# Patient Record
Sex: Male | Born: 1985 | Race: White | Hispanic: No | Marital: Married | Smoking: Current every day smoker
Health system: Southern US, Community
[De-identification: ages and names within clinical notes are randomized; demographics above are authoritative.]

## PROBLEM LIST (undated history)

## (undated) DIAGNOSIS — Z87442 Personal history of urinary calculi: Secondary | ICD-10-CM

## (undated) DIAGNOSIS — Z8619 Personal history of other infectious and parasitic diseases: Secondary | ICD-10-CM

## (undated) DIAGNOSIS — I471 Supraventricular tachycardia, unspecified: Secondary | ICD-10-CM

## (undated) DIAGNOSIS — N200 Calculus of kidney: Secondary | ICD-10-CM

## (undated) DIAGNOSIS — I10 Essential (primary) hypertension: Secondary | ICD-10-CM

## (undated) DIAGNOSIS — M549 Dorsalgia, unspecified: Secondary | ICD-10-CM

## (undated) DIAGNOSIS — I499 Cardiac arrhythmia, unspecified: Secondary | ICD-10-CM

## (undated) HISTORY — DX: Personal history of other infectious and parasitic diseases: Z86.19

## (undated) HISTORY — PX: FRACTURE SURGERY: SHX138

---

## 2003-02-12 HISTORY — PX: ORIF TIBIA & FIBULA FRACTURES: SHX2131

## 2004-12-03 ENCOUNTER — Emergency Department (HOSPITAL_COMMUNITY): Admission: EM | Admit: 2004-12-03 | Discharge: 2004-12-03 | Payer: Self-pay | Admitting: *Deleted

## 2012-10-18 ENCOUNTER — Emergency Department (HOSPITAL_BASED_OUTPATIENT_CLINIC_OR_DEPARTMENT_OTHER)
Admission: EM | Admit: 2012-10-18 | Discharge: 2012-10-18 | Disposition: A | Discharge status: Home / Self Care | Payer: BC Managed Care – PPO | Attending: Emergency Medicine | Admitting: Emergency Medicine

## 2012-10-18 ENCOUNTER — Encounter (HOSPITAL_BASED_OUTPATIENT_CLINIC_OR_DEPARTMENT_OTHER): Payer: Self-pay | Admitting: Emergency Medicine

## 2012-10-18 ENCOUNTER — Emergency Department (HOSPITAL_BASED_OUTPATIENT_CLINIC_OR_DEPARTMENT_OTHER): Payer: BC Managed Care – PPO

## 2012-10-18 DIAGNOSIS — N200 Calculus of kidney: Secondary | ICD-10-CM

## 2012-10-18 DIAGNOSIS — Z79899 Other long term (current) drug therapy: Secondary | ICD-10-CM | POA: Insufficient documentation

## 2012-10-18 HISTORY — DX: Calculus of kidney: N20.0

## 2012-10-18 LAB — URINALYSIS, ROUTINE W REFLEX MICROSCOPIC
Glucose, UA: NEGATIVE mg/dL
Ketones, ur: 15 mg/dL — AB
Leukocytes, UA: NEGATIVE
pH: 7.5 (ref 5.0–8.0)

## 2012-10-18 LAB — BASIC METABOLIC PANEL
Calcium: 9.6 mg/dL (ref 8.4–10.5)
Chloride: 101 mEq/L (ref 96–112)
Creatinine, Ser: 1.2 mg/dL (ref 0.50–1.35)
GFR calc Af Amer: >90 mL/min (ref >90)

## 2012-10-18 LAB — URINE MICROSCOPIC-ADD ON

## 2012-10-18 LAB — CBC
Platelets: 221 10*3/uL (ref 150–400)
RDW: 13.2 % (ref 11.5–15.5)
WBC: 6.8 10*3/uL (ref 4.0–10.5)

## 2012-10-18 MED ORDER — TAMSULOSIN HCL 0.4 MG PO CAPS: 0.4000 mg | ORAL_CAPSULE | Freq: Every day | ORAL | Status: DC

## 2012-10-18 MED ORDER — ONDANSETRON HCL 4 MG/2ML IJ SOLN
4.0000 mg | INTRAMUSCULAR | Status: AC
  Administered 2012-10-18: 4 mg via INTRAVENOUS
  Filled 2012-10-18: qty 2

## 2012-10-18 MED ORDER — OXYCODONE-ACETAMINOPHEN 5-325 MG PO TABS: 1.0000 | ORAL_TABLET | Freq: Four times a day (QID) | ORAL | Status: DC | PRN

## 2012-10-18 MED ORDER — KETOROLAC TROMETHAMINE 30 MG/ML IJ SOLN
30.0000 mg | Freq: Once | INTRAMUSCULAR | Status: AC
  Administered 2012-10-18: 30 mg via INTRAVENOUS
  Filled 2012-10-18: qty 1

## 2012-10-18 MED ORDER — ONDANSETRON HCL 4 MG PO TABS: 4.0000 mg | ORAL_TABLET | Freq: Four times a day (QID) | ORAL | Status: DC

## 2012-10-18 NOTE — ED Notes (Signed)
Left side flank pain with burning and hematuria since this am.  Pt states history of kidney stones.

## 2012-10-18 NOTE — ED Provider Notes (Signed)
CSN: [id number]     Arrival date & time 10/18/12  1743 History   First MD Initiated Contact with Patient 10/18/12 2006     Chief Complaint  Patient presents with  . Flank Pain  . Hematuria   (Consider location/radiation/quality/duration/timing/severity/associated sxs/prior Treatment) HPI Comments: Patient is a 27 year old male with a history of kidney stones x4 who presents for left flank pain with onset this morning. Patient states the pain is intermittent and wavelike and nonradiating. Patient has taken hydrocodone for the pain with mild relief. He admits to associated hematuria and denies associated nausea, emesis, fevers, chest pain or shortness of breath, anterior abdominal pain, dysuria, diarrhea, melena or hematochezia, and numbness or tingling in his extremities. Patient denies a history of lithotripsy or surgical intervention for stone removal. He is not currently followed by a urologist.  The history is provided by the patient. No language interpreter was used.    Past Medical History  Diagnosis Date  . Kidney stones   . Tachycardia    No past surgical history on file. No family history on file. History  Substance Use Topics  . Smoking status: Never Smoker   . Smokeless tobacco: Current User    Types: Chew  . Alcohol Use: Not on file    Review of Systems  Genitourinary: Positive for hematuria and flank pain.  All other systems reviewed and are negative.    Allergies  Review of patient's allergies indicates no known allergies.  Home Medications   Current Outpatient Rx  Name  Route  Sig  Dispense  Refill  . ondansetron (ZOFRAN) 4 MG tablet   Oral   Take 1 tablet (4 mg total) by mouth every 6 (six) hours.   12 tablet   0   . oxyCODONE-acetaminophen (PERCOCET/ROXICET) 5-325 MG per tablet   Oral   Take 1-2 tablets by mouth every 6 (six) hours as needed for pain.   19 tablet   0   . tamsulosin (FLOMAX) 0.4 MG CAPS capsule   Oral   Take 1 capsule (0.4 mg  total) by mouth daily. Until passage of your stone   10 capsule   0    BP 126/53  Pulse 70  Temp(Src) 98.5 F (36.9 C) (Oral)  Resp 16  Ht 5\' 8"  (1.727 m)  Wt 162 lb (73.483 kg)  BMI 24.64 kg/m2  SpO2 100%  Physical Exam  Nursing note and vitals reviewed. Constitutional: He is oriented to person, place, and time. He appears well-developed and well-nourished. No distress.  HENT:  Head: Normocephalic and atraumatic.  Eyes: Conjunctivae and EOM are normal. No scleral icterus.  Neck: Normal range of motion.  Cardiovascular: Normal rate, regular rhythm, normal heart sounds and intact distal pulses.   Pulmonary/Chest: Effort normal and breath sounds normal. No respiratory distress. He has no wheezes. He has no rales.  Abdominal: Soft. He exhibits no distension and no mass. There is tenderness (L CVA (mild)). There is no rebound and no guarding.  No peritoneal signs  Musculoskeletal: Normal range of motion.  Neurological: He is alert and oriented to person, place, and time.  Skin: Skin is warm and dry. No rash noted. He is not diaphoretic. No erythema. No pallor.  Psychiatric: He has a normal mood and affect. His behavior is normal.   ED Course  Procedures (including critical care time) Labs Review Labs Reviewed  URINALYSIS, ROUTINE W REFLEX MICROSCOPIC - Abnormal; Notable for the following:    APPearance CLOUDY (*)  Hgb urine dipstick MODERATE (*)    Ketones, ur 15 (*)    All other components within normal limits  URINE MICROSCOPIC-ADD ON - Abnormal; Notable for the following:    Bacteria, UA MANY (*)    All other components within normal limits  BASIC METABOLIC PANEL - Abnormal; Notable for the following:    GFR calc non Af Amer 81 (*)    All other components within normal limits  CBC   Imaging Review Ct Abdomen Pelvis Wo Contrast  10/18/2012   *RADIOLOGY REPORT*  Clinical Data: Left-sided flank pain with burning and hematuria since this morning.  History of kidney  stones.  CT ABDOMEN AND PELVIS WITHOUT CONTRAST  Technique:  Multidetector CT imaging of the abdomen and pelvis was performed following the standard protocol without intravenous contrast.  Comparison: None.  Findings: The lung bases are clear.  Two intrarenal stones are demonstrated in the mid pole of the left kidney, largest measuring about 2 mm diameter.  There is mild pyelocaliectasis and ureterectasis on the left with a tiny stone in the distal left ureter just above the ureterovesicle junction.  The stone measures about 1 mm diameter.  Changes are consistent with mild proximal obstruction.  No bladder stone or bladder wall thickening.  No right renal or ureteral stone demonstrated.  Right renal collecting system and ureter are decompressed.  The unenhanced appearance of the liver, spleen, gallbladder, pancreas, adrenal glands, abdominal aorta, inferior vena cava, and retroperitoneal lymph nodes is unremarkable.  Stomach is filled with food and fluid without wall thickening.  A small bowel are decompressed.  Stool filled colon without distension.  No free air or free fluid in the abdomen.  Pelvis:  Calcification in the prostate gland.  No free or loculated pelvic fluid collections.  Stool filled rectosigmoid colon without evidence of diverticulitis.  The appendix is normal.  No significant pelvic lymphadenopathy.  Normal alignment of the lumbar spine.  No destructive bone lesions appreciated.  IMPRESSION: Nonobstructing intrarenal stones on the left.  1 mm stone in the distal left ureter with mild proximal obstructing changes.   Original Report Authenticated By: Burman Nieves, M.D.    MDM   1. Kidney stone    Uncomplicated left distal ureteral kidney stone in 27 year old male patient with preserved kidney function. Patient well and nontoxic appearing, hemodynamically stable, and afebrile. Pain well managed in ED with Zofran and Toradol. Patient appropriate for discharge with urology followup for further  evaluation of symptoms. Flomax, Percocet, and Zofran prescribed for continued symptom management. Return precautions discussed and patient are agreeable to plan with no unaddressed concerns.   Antony Madura, PA-C 10/18/12 2145

## 2012-10-19 NOTE — ED Provider Notes (Signed)
Medical screening examination/treatment/procedure(s) were performed by non-physician practitioner and as supervising physician I was immediately available for consultation/collaboration.  Doug Sou, MD 10/19/12 [phone]

## 2014-05-04 ENCOUNTER — Encounter: Payer: Self-pay | Admitting: *Deleted

## 2014-05-04 ENCOUNTER — Ambulatory Visit (INDEPENDENT_AMBULATORY_CARE_PROVIDER_SITE_OTHER): Payer: PRIVATE HEALTH INSURANCE | Admitting: Internal Medicine

## 2014-05-04 ENCOUNTER — Encounter: Payer: Self-pay | Admitting: Internal Medicine

## 2014-05-04 VITALS — BP 120/74 | HR 61 | Ht 68.0 in | Wt 169.0 lb

## 2014-05-04 DIAGNOSIS — Z01812 Encounter for preprocedural laboratory examination: Secondary | ICD-10-CM | POA: Diagnosis not present

## 2014-05-04 DIAGNOSIS — I471 Supraventricular tachycardia: Secondary | ICD-10-CM | POA: Diagnosis not present

## 2014-05-04 LAB — CBC WITH DIFFERENTIAL/PLATELET
BASOS PCT: 0.9 % (ref 0.0–3.0)
Basophils Absolute: 0 10*3/uL (ref 0.0–0.1)
EOS PCT: 2.6 % (ref 0.0–5.0)
Eosinophils Absolute: 0.1 10*3/uL (ref 0.0–0.7)
HEMATOCRIT: 42.5 % (ref 39.0–52.0)
Hemoglobin: 14.6 g/dL (ref 13.0–17.0)
LYMPHS PCT: 34.4 % (ref 12.0–46.0)
Lymphs Abs: 1.9 10*3/uL (ref 0.7–4.0)
MCHC: 34.4 g/dL (ref 30.0–36.0)
MCV: 88.9 fl (ref 78.0–100.0)
MONOS PCT: 6.7 % (ref 3.0–12.0)
Monocytes Absolute: 0.4 10*3/uL (ref 0.1–1.0)
NEUTROS ABS: 3.1 10*3/uL (ref 1.4–7.7)
Neutrophils Relative %: 55.4 % (ref 43.0–77.0)
Platelets: 195 10*3/uL (ref 150.0–400.0)
RBC: 4.78 Mil/uL (ref 4.22–5.81)
RDW: 13.4 % (ref 11.5–15.5)
WBC: 5.6 10*3/uL (ref 4.0–10.5)

## 2014-05-04 LAB — BASIC METABOLIC PANEL
BUN: 9 mg/dL (ref 6–23)
CO2: 30 mEq/L (ref 19–32)
CREATININE: 0.82 mg/dL (ref 0.40–1.50)
Calcium: 9.3 mg/dL (ref 8.4–10.5)
Chloride: 106 mEq/L (ref 96–112)
GFR: 116.88 mL/min (ref >60.00)
GLUCOSE: 76 mg/dL (ref 70–99)
POTASSIUM: 3.8 meq/L (ref 3.5–5.1)
Sodium: 139 mEq/L (ref 135–145)

## 2014-05-04 NOTE — Progress Notes (Signed)
ELECTROPHYSIOLOGY CONSULT NOTE  Patient ID: Parker Carey, MRN: [id number], DOB/AGE: 31/06/1985 28 y.o. Admit date: (Not on file) Date of Consult: 05/04/2014  Primary Physician: No PCP Per Patient Primary Cardiologist: new  Chief Complaint: SVT   HPI Parker Carey is a 29 y.o. male  Seen with a 4 year history of abrupt onset offset tachypalpitations. They're associated with some degree of lightheadedness. They are frequently provoked by bending. They are frog negative and diuretic negative. On one occasion in [place] he was seen by EMS and given an intravenous medication almost certainly edematous and by his description and his documented tachycardia 240 bpm terminated.  He has no other cardiovascular symptoms.  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 He is concerned about the possibility of lightheadedness with the recurrent event.  He has been treated with beta blockers and calcium blockers now back again on beta blockers. They have been ineffective and poorly tolerated.   Past Medical History  Diagnosis Date  . Kidney stones   . Tachycardia       Surgical History: No past surgical history on file.   Home Meds: Prior to Admission medications   Medication Sig Start Date End Date Taking? Authorizing Provider  atenolol (TENORMIN) 25 MG tablet Take 25 mg by mouth daily.   Yes Historical Provider, MD      Allergies: No Known Allergies  History   Social History  . Marital Status: Married    Spouse Name: N/A  . Number of Children: N/A  . Years of Education: N/A   Occupational History  . Not on file.   Social History Main Topics  . Smoking status: Never Smoker   . Smokeless tobacco: Current User    Types: Chew  . Alcohol Use: Not on file  . Drug Use: Not on file  . Sexual Activity: Not on file   Other Topics Concern  . Not on file   Social History Narrative     No family history  on file.   ROS:  Please see the history of present illness.     All other systems reviewed and negative.    Physical Exam:   Blood pressure 120/74, pulse 61, height 5\' 8"  (1.727 m), weight 169 lb (76.658 kg). General: Well developed, well nourished male in no acute distress. Head: Normocephalic, atraumatic, sclera non-icteric, no xanthomas, nares are without discharge. EENT: normal Lymph Nodes:  none Back: without scoliosis/kyphosis , no CVA tendersness Neck: Negative for carotid bruits. JVD not elevated. Lungs: Clear bilaterally to auscultation without wheezes, rales, or rhonchi. Breathing is unlabored. Heart: RRR with S1 S2. No murmur , rubs, or gallops appreciated. Abdomen: Soft, non-tender, non-distended with normoactive bowel sounds. No hepatomegaly. No rebound/guarding. No obvious abdominal masses. Msk:  Strength and tone appear normal for age. Extremities: No clubbing or cyanosis. No* edema.  Distal pedal pulses are 2+ and equal bilaterally. Skin: Warm and Dry Neuro: Alert and oriented X 3. CN III-XII intact Grossly normal sensory and motor function . Psych:  Responds to questions appropriately with a normal affect.      Labs: Cardiac Enzymes No results for input(s): CKTOTAL, CKMB, TROPONINI in the last 72 hours. CBC Lab Results  Component Value Date   WBC 6.8 10/18/2012   HGB 14.4 10/18/2012   HCT 41.9 10/18/2012   MCV 89.1 10/18/2012   PLT 221 10/18/2012   PROTIME: No results for input(s): LABPROT, INR in the  last 72 hours. Chemistry No results for input(s): NA, K, CL, CO2, BUN, CREATININE, CALCIUM, PROT, BILITOT, ALKPHOS, ALT, AST, GLUCOSE in the last 168 hours.  Invalid input(s): LABALBU Lipids No results found for: CHOL, HDL, LDLCALC, TRIG BNP No results found for: PROBNP Thyroid Function Tests: No results for input(s): TSH, T4TOTAL, T3FREE, THYROIDAB in the last 72 hours.  Invalid input(s): FREET3    Miscellaneous No results found for:  DDIMER  Radiology/Studies:  No results found.  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 We have discussed treatment options. He is no longer interested in taking medications. We have discussed catheter ablation with benefits and risks including but not limited to death perforation and heart block requiring pacemaker implantation. 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

## 2014-05-04 NOTE — Patient Instructions (Signed)
Your physician recommends that you continue on your current medications as directed. Please refer to the Current Medication list given to you today.  Pre procedure labs today: BMET, CBCD  Your physician has recommended that you have an ablation. Catheter ablation is a medical procedure used to treat some cardiac arrhythmias (irregular heartbeats). During catheter ablation, a long, thin, flexible tube is put into a blood vessel in your groin (upper thigh), or neck. This tube is called an ablation catheter. It is then guided to your heart through the blood vessel. Radio frequency waves destroy small areas of heart tissue where abnormal heartbeats may cause an arrhythmia to start. Please see the instruction sheet given to you today.

## 2014-05-16 ENCOUNTER — Ambulatory Visit: Payer: Self-pay | Admitting: Internal Medicine

## 2014-05-19 NOTE — Anesthesia Preprocedure Evaluation (Addendum)
Anesthesia Evaluation  Patient identified by MRN, date of birth, ID band Patient awake    Reviewed: Allergy & Precautions, NPO status , Patient's Chart, lab work & pertinent test results  Airway Mallampati: II  TM Distance: >3 FB Neck ROM: Full    Dental  (+) Teeth Intact   Pulmonary neg pulmonary ROS,          Cardiovascular negative cardio ROS  + dysrhythmias Supra Ventricular Tachycardia     Neuro/Psych negative neurological ROS  negative psych ROS   GI/Hepatic negative GI ROS, Neg liver ROS,   Endo/Other  negative endocrine ROS  Renal/GU Renal diseasenegative Renal ROS     Musculoskeletal negative musculoskeletal ROS (+)   Abdominal   Peds  Hematology negative hematology ROS (+)   Anesthesia Other Findings   Reproductive/Obstetrics                            Anesthesia Physical Anesthesia Plan  ASA: I  Anesthesia Plan: MAC   Post-op Pain Management:    Induction: Intravenous  Airway Management Planned: Simple Face Mask and Natural Airway  Additional Equipment:   Intra-op Plan:   Post-operative Plan:   Informed Consent: I have reviewed the patients History and Physical, chart, labs and discussed the procedure including the risks, benefits and alternatives for the proposed anesthesia with the patient or authorized representative who has indicated his/her understanding and acceptance.   Dental advisory given  Plan Discussed with: CRNA  Anesthesia Plan Comments:         Anesthesia Quick Evaluation

## 2014-05-20 ENCOUNTER — Ambulatory Visit (HOSPITAL_COMMUNITY): Payer: PRIVATE HEALTH INSURANCE | Admitting: Anesthesiology

## 2014-05-20 ENCOUNTER — Ambulatory Visit (HOSPITAL_COMMUNITY)
Admission: RE | Admit: 2014-05-20 | Discharge: 2014-05-20 | Disposition: A | Discharge status: Home / Self Care | Payer: PRIVATE HEALTH INSURANCE | Source: Ambulatory Visit | Attending: Internal Medicine | Admitting: Internal Medicine

## 2014-05-20 ENCOUNTER — Encounter (HOSPITAL_COMMUNITY): Payer: Self-pay | Admitting: Anesthesiology

## 2014-05-20 ENCOUNTER — Encounter (HOSPITAL_COMMUNITY)
Admission: RE | Disposition: A | Discharge status: Home / Self Care | Payer: Self-pay | Source: Ambulatory Visit | Attending: Internal Medicine

## 2014-05-20 DIAGNOSIS — I471 Supraventricular tachycardia, unspecified: Secondary | ICD-10-CM

## 2014-05-20 DIAGNOSIS — Z79899 Other long term (current) drug therapy: Secondary | ICD-10-CM | POA: Insufficient documentation

## 2014-05-20 HISTORY — PX: SUPRAVENTRICULAR TACHYCARDIA ABLATION: SHX6106

## 2014-05-20 HISTORY — DX: Supraventricular tachycardia, unspecified: I47.10

## 2014-05-20 HISTORY — DX: Supraventricular tachycardia: I47.1

## 2014-05-20 HISTORY — PX: SUPRAVENTRICULAR TACHYCARDIA ABLATION: SHX5492

## 2014-05-20 LAB — CBC
HCT: 44.2 % (ref 39.0–52.0)
HEMOGLOBIN: 15.1 g/dL (ref 13.0–17.0)
MCH: 30.9 pg (ref 26.0–34.0)
MCHC: 34.2 g/dL (ref 30.0–36.0)
MCV: 90.6 fL (ref 78.0–100.0)
Platelets: 226 10*3/uL (ref 150–400)
RBC: 4.88 MIL/uL (ref 4.22–5.81)
RDW: 13.3 % (ref 11.5–15.5)
WBC: 5.3 10*3/uL (ref 4.0–10.5)

## 2014-05-20 LAB — BASIC METABOLIC PANEL WITH GFR
Anion gap: 7 (ref 5–15)
BUN: 10 mg/dL (ref 6–23)
CO2: 26 mmol/L (ref 19–32)
Calcium: 9.4 mg/dL (ref 8.4–10.5)
Chloride: 108 mmol/L (ref 96–112)
Creatinine, Ser: 0.75 mg/dL (ref 0.50–1.35)
GFR calc Af Amer: >90 mL/min
GFR calc non Af Amer: >90 mL/min
Glucose, Bld: 91 mg/dL (ref 70–99)
Potassium: 3.7 mmol/L (ref 3.5–5.1)
Sodium: 141 mmol/L (ref 135–145)

## 2014-05-20 SURGERY — SUPRAVENTRICULAR TACHYCARDIA ABLATION
Anesthesia: Monitor Anesthesia Care

## 2014-05-20 MED ORDER — FENTANYL CITRATE 0.05 MG/ML IJ SOLN
INTRAMUSCULAR | Status: DC | PRN
  Administered 2014-05-20 (×2): 50 ug via INTRAVENOUS
  Administered 2014-05-20 (×2): 25 ug via INTRAVENOUS
  Administered 2014-05-20: 50 ug via INTRAVENOUS

## 2014-05-20 MED ORDER — SODIUM CHLORIDE 0.9 % IV SOLN
2.0000 ug/min | INTRAVENOUS | Status: AC
  Administered 2014-05-20: 2 ug/min via INTRAVENOUS
  Filled 2014-05-20: qty 2

## 2014-05-20 MED ORDER — PROPOFOL INFUSION 10 MG/ML OPTIME
INTRAVENOUS | Status: DC | PRN
  Administered 2014-05-20: 25 ug/kg/min via INTRAVENOUS

## 2014-05-20 MED ORDER — PHENYLEPHRINE HCL 10 MG/ML IJ SOLN
10.0000 mg | INTRAVENOUS | Status: DC | PRN
  Administered 2014-05-20: 60 ug/min via INTRAVENOUS

## 2014-05-20 MED ORDER — ONDANSETRON HCL 4 MG/2ML IJ SOLN
INTRAMUSCULAR | Status: DC | PRN
  Administered 2014-05-20: 4 mg via INTRAVENOUS

## 2014-05-20 MED ORDER — SODIUM CHLORIDE 0.9 % IJ SOLN: 3.0000 mL | INTRAMUSCULAR | Status: DC | PRN

## 2014-05-20 MED ORDER — ACETAMINOPHEN 325 MG PO TABS: 650.0000 mg | ORAL_TABLET | ORAL | Status: DC | PRN

## 2014-05-20 MED ORDER — ONDANSETRON HCL 4 MG/2ML IJ SOLN: 4.0000 mg | Freq: Four times a day (QID) | INTRAMUSCULAR | Status: DC | PRN

## 2014-05-20 MED ORDER — HYDROCODONE-ACETAMINOPHEN 5-325 MG PO TABS
1.0000 | ORAL_TABLET | ORAL | Status: DC | PRN
  Administered 2014-05-20 (×2): 2 via ORAL
  Filled 2014-05-20 (×2): qty 2

## 2014-05-20 MED ORDER — MIDAZOLAM HCL 5 MG/5ML IJ SOLN
INTRAMUSCULAR | Status: DC | PRN
  Administered 2014-05-20: 1 mg via INTRAVENOUS
  Administered 2014-05-20 (×2): 0.5 mg via INTRAVENOUS
  Administered 2014-05-20: 1 mg via INTRAVENOUS

## 2014-05-20 MED ORDER — LACTATED RINGERS IV SOLN
INTRAVENOUS | Status: DC | PRN
  Administered 2014-05-20: 07:00:00 via INTRAVENOUS

## 2014-05-20 MED ORDER — BUPIVACAINE HCL (PF) 0.25 % IJ SOLN
INTRAMUSCULAR | Status: AC
  Filled 2014-05-20: qty 30

## 2014-05-20 MED ORDER — SODIUM CHLORIDE 0.9 % IV SOLN: 250.0000 mL | INTRAVENOUS | Status: DC | PRN

## 2014-05-20 MED ORDER — SODIUM CHLORIDE 0.9 % IJ SOLN
3.0000 mL | Freq: Two times a day (BID) | INTRAMUSCULAR | Status: DC
Start: 2014-05-20 — End: 2014-05-20
  Administered 2014-05-20: 3 mL via INTRAVENOUS

## 2014-05-20 MED ORDER — ISOPROTERENOL HCL 0.2 MG/ML IJ SOLN
1000.0000 ug | INTRAVENOUS | Status: DC | PRN
  Administered 2014-05-20: 2 ug/min via INTRAVENOUS

## 2014-05-20 NOTE — Transfer of Care (Signed)
Immediate Anesthesia Transfer of Care Note  Patient: Parker Carey  Procedure(s) Performed: Procedure(s): SUPRAVENTRICULAR TACHYCARDIA ABLATION (N/A)  Patient Location: PACU, Cath Lab and Cath Lab Recovery  Anesthesia Type:MAC  Level of Consciousness: awake, alert , oriented and patient cooperative  Airway & Oxygen Therapy: Patient Spontanous Breathing  Post-op Assessment: Report given to RN, Post -op Vital signs reviewed and stable and SpO2 100 on rm air  Post vital signs: Reviewed and stable  Last Vitals:  Filed Vitals:   05/20/14 0612  BP: 115/54  Pulse: 76  Temp: 36.7 C  Resp: 17    Complications: No apparent anesthesia complications

## 2014-05-20 NOTE — Addendum Note (Signed)
Addendum  created 05/20/14 1430 by Marni GriffonKaren B Janet Decesare, CRNA   Modules edited: Anesthesia Attestations

## 2014-05-20 NOTE — Op Note (Signed)
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 Comprehensive EP study.  2. Coronary sinus pacing and recording.  3. Mapping of supraventricular tachycardia.  4. Radiofrequency ablation of supraventricular tachycardia.  5. Arrhythmia induction with isuprel infused   INTRODUCTION: Parker Carey is a 29 y.o. male with a history of symptomatic recurrent SVT who presents today for EP study and radiofrequency ablation. The patient has had recurrent symptomatic SVT. He has failed medical therapy with beta blockers. He now presents for EP study and radiofrequency ablation of SVT.   DESCRIPTION OF PROCEDURE: Informed written consent was obtained and the patient was brought to the Electrophysiology Lab in the fasting state. The patient was adequately sedated with intravenous medication as outlined in the anesthesia report. The patient's right neck and groin was prepped and draped in the usual sterile fashion by the EP Lab staff. Using a percutaneous Seldinger technique, a 6 F hemostasis sheath was placed into the right internal jugular vein. A 6 F curved hexapolar catheter was advised through the RIJ into the coronary sinus for pacing and recording. Two 6-French and one 8-French hemostasis sheaths were placed  into the right common femoral vein. Two 6-French quadripolar Josephson catheters were introduced through the right common femoral vein and advanced into the His bundle and right ventricular apex positions for recording and pacing.   Presenting Measurements: The patient presented to the Electrophysiology Lab in sinus rhythm. The PR interval was 183 msec with a QRS of 106 msec and a Qt of 435 msec. The average RR interval was 1107 msec. The AH interval was 131 msec and the HV interval was 46 milliseconds.   EP study:  Ventricular pacing was performed which reveals VA dissociation at baseline when pacing at  a CL of 600 msec.  Rapid atrial pacing was performed with reveals PR > RR with tachycardia not induced and an AVWCL of 510 msec. AEST was performed which revealed no AH jump with no echo beats. Tachycardia was not induced. The AVNERP was 600/438msec.   Isuprel was infused at 6mcg/min with an adequate acceleration in HR response observed. PR remained >> RR. Tachycardia was not induced. The AVWCL was 290 msec. AEST was performed which again revealed multiple AH jumps and frequent triple echo beats and nonsustained AVNRT. At 500/228msec the patient had an AH jump followed by sustained narrow complex SVT. The tachycardia cycle length was 295 msec.   VA time during tachycardia measured with earliest retrograde atrial activation recorded from the His electrogram. The tachycardia terminated spontaneously with an atrial activation.  This was a reproducible event. Episodes were all nonsustained and therefore pacing maneuvers during tachycardia could not be performed.  The patient was therefore felt to have classic AV nodal reentrant tachycardia. I therefore elected to perform slow pathway modification.  Isuprel was discontinued and allowed to washout.   Ablation:  A 45F "D curve" BW 4mm ablation catheter was therefore advanced through the right femoral vein and advanced into the right atrium. Mapping of Koch's triangle was performed which revealed a standard sized triangle. A single lesion was delivered at site 9 in Koch's triangle with a target temperature of 60 degrees of 50 watts for 15 seconds.   A second RF lesion was applied at site 8 in Koch's triangle. Accelerated junctional rhythm was observed however RF was discontinued after 58 seconds due to a single dropped retrograde beat.   Measurements following  ablation:  Following ablation, Rapid atrial pacing was again performed with PR<RR and an AV WCL of 500 msec.  Isuprel was again infused at 2 mcg/min with an adequate acceleration in heart rate  response. RAP revealed PR<RR with an AVWCL of 320 msec.  AEST was performed which revealed no AH jumps, echo beats, or tachycardias induced. The AVN ERP was 450/220 msec.  No arrhythmias were induced. V pacing was performed which again revealed midline concentric and decremental VA conduction with no retrograde jumps, echo beats, or tachycardias.  Isuprel was again infused at 4 mcg/min with an adequate acceleration in heart rate response. RAP revealed PR<RR with an AVWCL of 270 msec.  AEST was performed which revealed no AH jumps, echo beats, or tachycardias induced. The AVN ERP was 450/210 msec.  No arrhythmias were induced.  Following ablation the AH interval was 87 msec with an HV interval of 44 msec. The procedure was therefore considered completed. All catheters were removed and the sheaths were aspirated and flushed. The sheaths were removed and hemostasis was assured. EBL<8510ml. There were no early apparent complications.   CONCLUSIONS:  1. Sinus rhythm upon presentation.  2. The patient had dual AV nodal physiology with easily inducible classic AV nodal reentrant tachycardia, there were no other accessory pathways or arrhythmias induced  3. Successful radiofrequency modification of the slow AV nodal pathway  4. No inducible arrhythmias following ablation both on and off of isuprel 5. No early apparent complications.   Hillis RangeJames Saralyn Willison MD, [facility] CenterFACC 05/20/2014 9:32 AM

## 2014-05-20 NOTE — Anesthesia Postprocedure Evaluation (Signed)
  Anesthesia Post-op Note  Patient: Parker Carey  Procedure(s) Performed: Procedure(s): SUPRAVENTRICULAR TACHYCARDIA ABLATION (N/A)  Patient Location: PACU and Cath Lab  Anesthesia Type:MAC  Level of Consciousness: awake, alert , oriented and patient cooperative  Airway and Oxygen Therapy: Patient Spontanous Breathing and Rm Air SpO2 100  Post-op Pain: mild, Pt complains that groin is sore.  Post-op Assessment: Post-op Vital signs reviewed, Patient's Cardiovascular Status Stable, Respiratory Function Stable, Patent Airway and No signs of Nausea or vomiting  Post-op Vital Signs: Reviewed and stable  Last Vitals:  Filed Vitals:   05/20/14 0612  BP: 115/54  Pulse: 76  Temp: 36.7 C  Resp: 17    Complications: No apparent anesthesia complications

## 2014-05-20 NOTE — Progress Notes (Signed)
Site area: RFV x 3/ RT IJ Site Prior to Removal:  Level 0/0 Pressure Applied For:20 min Manual:  yes  Patient Status During Pull:  stable Post Pull Site:  Level 0 Post Pull Instructions Given:  yes Post Pull Pulses Present: palpable Dressing Applied:  clear Bedrest begins @ 1030 Comments:

## 2014-05-20 NOTE — Discharge Instructions (Signed)
No driving for 4 days. No lifting over 5 lbs for 1 week. No sexual activity for 1 week. You may return to work in 1 week. Keep procedure site clean & dry. If you notice increased pain, swelling, bleeding or pus, call/return!  You may shower, but no soaking baths/hot tubs/pools for 1 week.  ° ° °

## 2014-05-20 NOTE — Progress Notes (Signed)
Doing well post AVNRT ablation Plan to discharge to home after bedrest complete and patient ambulates.  Will discontinue Atenolol Wound care/restrictions reviewed with patient.  Gypsy BalsamAmber Seiler, NP 05/20/2014 3:55 PM  Hillis RangeJames Chelesea Weiand MD, [facility] CenterFACC 05/20/2014 3:57 PM

## 2014-05-20 NOTE — Progress Notes (Signed)
Pt discharged home via wife. No c/o respiratory distress. Incision sites CDI with pressure dressings. Education complete. IV removed with no complications. Pt discharged via wheelchair. Jillyn HiddenStone,Marty Sadlowski R, RN

## 2014-05-20 NOTE — Interval H&P Note (Signed)
History and Physical Interval Note:  05/20/2014 7:19 AM  Manus Gunningerrick M Rubinstein  has presented today for surgery, with the diagnosis of svt  The various methods of treatment have been discussed with the patient and family. After consideration of risks, benefits and other options for treatment, the patient has consented to  Procedure(s): SUPRAVENTRICULAR TACHYCARDIA ABLATION (N/A) as a surgical intervention .  The patient's history has been reviewed, patient examined, no change in status, stable for surgery.  I have reviewed the patient's chart and labs.  Questions were answered to the patient's satisfaction.    Risk, benefits, and alternatives to EP study and radiofrequency ablation were also discussed in detail today. These risks include but are not limited to stroke, bleeding, vascular damage, tamponade, perforation, damage to the heart and other structures, AV block requiring pacemaker, worsening renal function, and death. The patient understands these risk and wishes to proceed.      Hillis RangeJames Kinsey Karch

## 2014-05-20 NOTE — H&P (View-Only) (Signed)
ELECTROPHYSIOLOGY CONSULT NOTE  Patient ID: Parker Carey, MRN: [id number], DOB/AGE: 31/06/1985 30 y.o. Admit date: (Not on file) Date of Consult: 05/04/2014  Primary Physician: No PCP Per Patient Primary Cardiologist: new  Chief Complaint: SVT   HPI Parker Carey is a 29 y.o. male  Seen with a 4 year history of abrupt onset offset tachypalpitations. They're associated with some degree of lightheadedness. They are frequently provoked by bending. They are frog negative and diuretic negative. On one occasion in [place] he was seen by EMS and given an intravenous medication almost certainly edematous and by his description and his documented tachycardia 240 bpm terminated.  He has no other cardiovascular symptoms.  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 He is concerned about the possibility of lightheadedness with the recurrent event.  He has been treated with beta blockers and calcium blockers now back again on beta blockers. They have been ineffective and poorly tolerated.   Past Medical History  Diagnosis Date  . Kidney stones   . Tachycardia       Surgical History: No past surgical history on file.   Home Meds: Prior to Admission medications   Medication Sig Start Date End Date Taking? Authorizing Provider  atenolol (TENORMIN) 25 MG tablet Take 25 mg by mouth daily.   Yes Historical Provider, MD      Allergies: No Known Allergies  History   Social History  . Marital Status: Married    Spouse Name: N/A  . Number of Children: N/A  . Years of Education: N/A   Occupational History  . Not on file.   Social History Main Topics  . Smoking status: Never Smoker   . Smokeless tobacco: Current User    Types: Chew  . Alcohol Use: Not on file  . Drug Use: Not on file  . Sexual Activity: Not on file   Other Topics Concern  . Not on file   Social History Narrative     No family history  on file.   ROS:  Please see the history of present illness.     All other systems reviewed and negative.    Physical Exam:   Blood pressure 120/74, pulse 61, height 5\' 8"  (1.727 m), weight 169 lb (76.658 kg). General: Well developed, well nourished male in no acute distress. Head: Normocephalic, atraumatic, sclera non-icteric, no xanthomas, nares are without discharge. EENT: normal Lymph Nodes:  none Back: without scoliosis/kyphosis , no CVA tendersness Neck: Negative for carotid bruits. JVD not elevated. Lungs: Clear bilaterally to auscultation without wheezes, rales, or rhonchi. Breathing is unlabored. Heart: RRR with S1 S2. No murmur , rubs, or gallops appreciated. Abdomen: Soft, non-tender, non-distended with normoactive bowel sounds. No hepatomegaly. No rebound/guarding. No obvious abdominal masses. Msk:  Strength and tone appear normal for age. Extremities: No clubbing or cyanosis. No* edema.  Distal pedal pulses are 2+ and equal bilaterally. Skin: Warm and Dry Neuro: Alert and oriented X 3. CN III-XII intact Grossly normal sensory and motor function . Psych:  Responds to questions appropriately with a normal affect.      Labs: Cardiac Enzymes No results for input(s): CKTOTAL, CKMB, TROPONINI in the last 72 hours. CBC Lab Results  Component Value Date   WBC 6.8 10/18/2012   HGB 14.4 10/18/2012   HCT 41.9 10/18/2012   MCV 89.1 10/18/2012   PLT 221 10/18/2012   PROTIME: No results for input(s): LABPROT, INR in the  last 72 hours. Chemistry No results for input(s): NA, K, CL, CO2, BUN, CREATININE, CALCIUM, PROT, BILITOT, ALKPHOS, ALT, AST, GLUCOSE in the last 168 hours.  Invalid input(s): LABALBU Lipids No results found for: CHOL, HDL, LDLCALC, TRIG BNP No results found for: PROBNP Thyroid Function Tests: No results for input(s): TSH, T4TOTAL, T3FREE, THYROIDAB in the last 72 hours.  Invalid input(s): FREET3    Miscellaneous No results found for:  DDIMER  Radiology/Studies:  No results found.  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 We have discussed treatment options. He is no longer interested in taking medications. We have discussed catheter ablation with benefits and risks including but not limited to death perforation and heart block requiring pacemaker implantation. He is quite eager to proceed with definitive therapy as he works on [employer] with heavy materials  Reviewed with Dr Fawn KirkJA who saw and spoke briefly with the patient    Sherryl MangesSteven Loraina Stauffer

## 2014-06-22 ENCOUNTER — Encounter: Payer: PRIVATE HEALTH INSURANCE | Admitting: Internal Medicine

## 2014-06-30 ENCOUNTER — Ambulatory Visit (INDEPENDENT_AMBULATORY_CARE_PROVIDER_SITE_OTHER): Payer: PRIVATE HEALTH INSURANCE | Admitting: Internal Medicine

## 2014-06-30 ENCOUNTER — Encounter: Payer: Self-pay | Admitting: Internal Medicine

## 2014-06-30 VITALS — BP 120/80 | HR 61 | Ht 68.0 in | Wt 162.0 lb

## 2014-06-30 DIAGNOSIS — I471 Supraventricular tachycardia: Secondary | ICD-10-CM | POA: Diagnosis not present

## 2014-06-30 NOTE — Patient Instructions (Signed)
Medication Instructions:  Your physician recommends that you continue on your current medications as directed. Please refer to the Current Medication list given to you today.   Labwork: None ordered   Testing/Procedures: None ordered   Follow-Up: Your physician recommends that you schedule a follow-up appointment as needed    Any Other Special Instructions Will Be Listed Below (If Applicable).   

## 2014-06-30 NOTE — Progress Notes (Signed)
PCP: Darrow BussingKOIRALA,DIBAS, MD  Manus GunningDerrick M Carey is a 29 y.o. male who presents today for routine electrophysiology followup.  Since his SVT ablation, the patient reports doing very well.  He has had no further SVT.  He denies procedure related complications.  Today, he denies symptoms of palpitations, chest pain, shortness of breath,  lower extremity edema, dizziness, presyncope, or syncope.  The patient is otherwise without complaint today.   Past Medical History  Diagnosis Date  . Kidney stones   . SVT (supraventricular tachycardia)    Past Surgical History  Procedure Laterality Date  . Supraventricular tachycardia ablation  05/20/2014  . Fracture surgery    . Orif tibia & fibula fractures Right 2005  . Supraventricular tachycardia ablation N/A 05/20/2014    Procedure: SUPRAVENTRICULAR TACHYCARDIA ABLATION;  Surgeon: Hillis RangeJames Ibrahima Holberg, MD;  [facility];  Service: Cardiovascular;  Laterality: N/A;    ROS- all systems are reviewed and negatives except as per HPI above  No current outpatient prescriptions on file.   No current facility-administered medications for this visit.    Physical Exam: Filed Vitals:   06/30/14 1226  BP: 120/80  Pulse: 61  Height: 5\' 8"  (1.727 m)  Weight: 162 lb (73.483 kg)    GEN- The patient is well appearing, alert and oriented x 3 today.   Head- normocephalic, atraumatic Eyes-  Sclera clear, conjunctiva pink Ears- hearing intact Oropharynx- clear Lungs- Clear to ausculation bilaterally, normal work of breathing Heart- Regular rate and rhythm, no murmurs, rubs or gallops, PMI not laterally displaced GI- soft, NT, ND, + BS Extremities- no clubbing, cyanosis, or edema  ekg today reveals sinus rhythm  Assessment and Plan:  1. SVT No recurrence No further workup planned  Return as needed  Hillis RangeJames Banks Chaikin MD, [facility] 06/30/2014 12:55 PM

## 2015-06-26 ENCOUNTER — Telehealth: Payer: Self-pay | Admitting: Internal Medicine

## 2015-06-26 NOTE — Telephone Encounter (Signed)
Pt calling c/o procedure he had done last year-SVT Ablation on 05-20-14--pls call [phone]

## 2015-06-26 NOTE — Telephone Encounter (Addendum)
Returned a call to patient, he has been fighting with insurance as they are saying his condition is pre-existing.  i let him know when he was seen by Dr Graciela HusbandsKlein it was said in the notes that he had SVT so depending on when he got the insurance it could have been.  I have sent him all of his records and he is going to request his records from ER trip in [place]

## 2016-06-28 ENCOUNTER — Encounter (HOSPITAL_BASED_OUTPATIENT_CLINIC_OR_DEPARTMENT_OTHER): Payer: Self-pay | Admitting: Emergency Medicine

## 2016-06-28 ENCOUNTER — Emergency Department (HOSPITAL_BASED_OUTPATIENT_CLINIC_OR_DEPARTMENT_OTHER)
Admission: EM | Admit: 2016-06-28 | Discharge: 2016-06-28 | Disposition: A | Discharge status: Home / Self Care | Payer: 59 | Attending: Emergency Medicine | Admitting: Emergency Medicine

## 2016-06-28 ENCOUNTER — Emergency Department (HOSPITAL_BASED_OUTPATIENT_CLINIC_OR_DEPARTMENT_OTHER): Payer: 59

## 2016-06-28 DIAGNOSIS — N201 Calculus of ureter: Secondary | ICD-10-CM

## 2016-06-28 DIAGNOSIS — F17210 Nicotine dependence, cigarettes, uncomplicated: Secondary | ICD-10-CM | POA: Insufficient documentation

## 2016-06-28 DIAGNOSIS — R319 Hematuria, unspecified: Secondary | ICD-10-CM | POA: Diagnosis present

## 2016-06-28 LAB — URINALYSIS, ROUTINE W REFLEX MICROSCOPIC
Bilirubin Urine: NEGATIVE
GLUCOSE, UA: NEGATIVE mg/dL
Ketones, ur: NEGATIVE mg/dL
NITRITE: NEGATIVE
PROTEIN: 30 mg/dL — AB
Specific Gravity, Urine: 1.011 (ref 1.005–1.030)
pH: 5.5 (ref 5.0–8.0)

## 2016-06-28 LAB — CBC
HCT: 45.5 % (ref 39.0–52.0)
Hemoglobin: 15.8 g/dL (ref 13.0–17.0)
MCH: 31.4 pg (ref 26.0–34.0)
MCHC: 34.7 g/dL (ref 30.0–36.0)
MCV: 90.5 fL (ref 78.0–100.0)
PLATELETS: 227 10*3/uL (ref 150–400)
RBC: 5.03 MIL/uL (ref 4.22–5.81)
RDW: 13.5 % (ref 11.5–15.5)
WBC: 8.5 10*3/uL (ref 4.0–10.5)

## 2016-06-28 LAB — COMPREHENSIVE METABOLIC PANEL
ALT: 19 U/L (ref 17–63)
AST: 22 U/L (ref 15–41)
Albumin: 4.6 g/dL (ref 3.5–5.0)
Alkaline Phosphatase: 76 U/L (ref 38–126)
Anion gap: 9 (ref 5–15)
BUN: 9 mg/dL (ref 6–20)
CALCIUM: 9 mg/dL (ref 8.9–10.3)
CHLORIDE: 103 mmol/L (ref 101–111)
CO2: 25 mmol/L (ref 22–32)
Creatinine, Ser: 0.85 mg/dL (ref 0.61–1.24)
GFR calc Af Amer: >60 mL/min (ref >60)
GFR calc non Af Amer: >60 mL/min (ref >60)
Glucose, Bld: 97 mg/dL (ref 65–99)
Potassium: 3.6 mmol/L (ref 3.5–5.1)
SODIUM: 137 mmol/L (ref 135–145)
Total Bilirubin: 0.7 mg/dL (ref 0.3–1.2)
Total Protein: 7.3 g/dL (ref 6.5–8.1)

## 2016-06-28 LAB — URINALYSIS, MICROSCOPIC (REFLEX)

## 2016-06-28 MED ORDER — ONDANSETRON 4 MG PO TBDP: 4.0000 mg | ORAL_TABLET | Freq: Three times a day (TID) | ORAL | 1 refills | Status: DC | PRN

## 2016-06-28 MED ORDER — SODIUM CHLORIDE 0.9 % IV BOLUS (SEPSIS)
1000.0000 mL | Freq: Once | INTRAVENOUS | Status: AC
  Administered 2016-06-28: 1000 mL via INTRAVENOUS

## 2016-06-28 MED ORDER — HYDROCODONE-ACETAMINOPHEN 5-325 MG PO TABS: 1.0000 | ORAL_TABLET | Freq: Four times a day (QID) | ORAL | 0 refills | Status: DC | PRN

## 2016-06-28 MED ORDER — ONDANSETRON HCL 4 MG/2ML IJ SOLN
4.0000 mg | Freq: Once | INTRAMUSCULAR | Status: AC
  Administered 2016-06-28: 4 mg via INTRAVENOUS
  Filled 2016-06-28: qty 2

## 2016-06-28 MED ORDER — KETOROLAC TROMETHAMINE 15 MG/ML IJ SOLN
15.0000 mg | Freq: Once | INTRAMUSCULAR | Status: AC
  Administered 2016-06-28: 15 mg via INTRAVENOUS
  Filled 2016-06-28: qty 1

## 2016-06-28 MED ORDER — NAPROXEN 500 MG PO TABS: 500.0000 mg | ORAL_TABLET | Freq: Two times a day (BID) | ORAL | 0 refills | Status: DC

## 2016-06-28 MED ORDER — SODIUM CHLORIDE 0.9 % IV SOLN: INTRAVENOUS | Status: DC

## 2016-06-28 MED FILL — ONDANSETRON ODT 4 MG TABLET: 4 | 10 days supply | Qty: 10 | Fill #0

## 2016-06-28 MED FILL — NAPROXEN 500 MG TABLET: 500 | 7 days supply | Qty: 14 | Fill #0

## 2016-06-28 MED FILL — HYDROCODON-APAP 5-325: 5-325 | 2 days supply | Qty: 14 | Fill #0

## 2016-06-28 NOTE — Discharge Instructions (Signed)
Would expect you to pass the stone in the next 2 days. If not follow-up with urology. Take Naprosyn on a regular basis. Take Zofran as needed for nausea. Take the hydrocodone as needed for additional pain relief. Return for any new or worse symptoms.

## 2016-06-28 NOTE — ED Triage Notes (Signed)
Pt reports hematuria, burning, frequency, and L side back pain x 2 days.

## 2016-06-28 NOTE — ED Provider Notes (Signed)
[facility] Note   CSN: [id number] Arrival date & time: 06/28/16  4098     History   Chief Complaint Chief Complaint  Patient presents with  . Hematuria    HPI Parker Carey is a 31 y.o. male.  Patient with acute onset of left flank pain this morning at about 5:30. Pain is currently 4 out of 10. Also has noted blood in the urine for a few days. Patient's had a history of kidney stones in the past. And he usually does get blood. No nausea no vomiting. Patient currently does not have a urologist.      Past Medical History:  Diagnosis Date  . Kidney stones   . SVT (supraventricular tachycardia) [facility])     Patient Active Problem List   Diagnosis Date Noted  . SVT (supraventricular tachycardia) (HCC) 05/20/2014    Past Surgical History:  Procedure Laterality Date  . FRACTURE SURGERY    . ORIF TIBIA & FIBULA FRACTURES Right 2005  . SUPRAVENTRICULAR TACHYCARDIA ABLATION  05/20/2014  . SUPRAVENTRICULAR TACHYCARDIA ABLATION N/A 05/20/2014   Procedure: SUPRAVENTRICULAR TACHYCARDIA ABLATION;  Surgeon: Hillis Range, MD;  Location: [facility] CATH LAB;  Service: Cardiovascular;  Laterality: N/A;       Home Medications    Prior to Admission medications   Medication Sig Start Date End Date Taking? Authorizing Provider  HYDROcodone-acetaminophen (NORCO/VICODIN) 5-325 MG tablet Take 1-2 tablets by mouth every 6 (six) hours as needed for moderate pain. 06/28/16   Vanetta Mulders, MD  naproxen (NAPROSYN) 500 MG tablet Take 1 tablet (500 mg total) by mouth 2 (two) times daily. 06/28/16   Vanetta Mulders, MD  ondansetron (ZOFRAN ODT) 4 MG disintegrating tablet Take 1 tablet (4 mg total) by mouth every 8 (eight) hours as needed for nausea or vomiting. 06/28/16   Vanetta Mulders, MD    Family History Family History  Problem Relation Age of Onset  . Cancer Maternal Grandmother     Social History Social History  Substance Use Topics  . Smoking status: Current Some  Day Smoker    Years: 5.00    Types: Cigarettes  . Smokeless tobacco: Current User    Types: Chew  . Alcohol use 3.6 oz/week    6 Cans of beer per week     Allergies   Patient has no known allergies.   Review of Systems Review of Systems  Constitutional: Negative for fever.  HENT: Negative for congestion.   Eyes: Negative for redness.  Respiratory: Negative for shortness of breath.   Cardiovascular: Negative for chest pain.  Gastrointestinal: Negative for abdominal pain, nausea and vomiting.  Genitourinary: Positive for flank pain and hematuria.  Musculoskeletal: Positive for back pain.  Skin: Negative for rash.  Neurological: Negative for syncope.  Hematological: Does not bruise/bleed easily.  Psychiatric/Behavioral: Negative for confusion.     Physical Exam Updated Vital Signs BP 139/70 (BP Location: Left Arm)   Pulse 68   Temp 98 F (36.7 C) (Oral)   Resp 16   Ht 5\' 8"  (1.727 m)   Wt 165 lb (74.8 kg)   SpO2 100%   BMI 25.09 kg/m   Physical Exam  Constitutional: He is oriented to person, place, and time. He appears well-developed and well-nourished. No distress.  HENT:  Head: Normocephalic and atraumatic.  Mouth/Throat: Oropharynx is clear and moist.  Eyes: Conjunctivae and EOM are normal. Pupils are equal, round, and reactive to light.  Neck: Normal range of motion. Neck supple.  Cardiovascular: Normal  rate, regular rhythm and normal heart sounds.   Pulmonary/Chest: Effort normal and breath sounds normal.  Abdominal: Soft. Bowel sounds are normal. There is no tenderness.  Musculoskeletal: Normal range of motion. He exhibits no edema.  Neurological: He is alert and oriented to person, place, and time. No cranial nerve deficit or sensory deficit. He exhibits normal muscle tone. Coordination normal.  Skin: Skin is warm.  Nursing note and vitals reviewed.    ED Treatments / Results  Labs (all labs ordered are listed, but only abnormal results are  displayed) Labs Reviewed  URINALYSIS, ROUTINE W REFLEX MICROSCOPIC - Abnormal; Notable for the following:       Result Value   Color, Urine RED (*)    APPearance CLOUDY (*)    Hgb urine dipstick LARGE (*)    Protein, ur 30 (*)    Leukocytes, UA TRACE (*)    All other components within normal limits  URINALYSIS, MICROSCOPIC (REFLEX) - Abnormal; Notable for the following:    Bacteria, UA MANY (*)    Squamous Epithelial / LPF 0-5 (*)    All other components within normal limits  CBC  COMPREHENSIVE METABOLIC PANEL    EKG  EKG Interpretation None       Radiology Ct Renal Stone Study  Result Date: 06/28/2016 CLINICAL DATA:  Left flank pain for 2 days EXAM: CT ABDOMEN AND PELVIS WITHOUT CONTRAST TECHNIQUE: Multidetector CT imaging of the abdomen and pelvis was performed following the standard protocol without oral or intravenous contrast material administration. COMPARISON:  October 18, 2012 FINDINGS: Lower chest: Lung bases are clear. Hepatobiliary: No focal liver lesions are appreciable on this noncontrast enhanced study. Gallbladder wall is not appreciably thickened. There is no biliary duct dilatation. Pancreas: No pancreatic mass or inflammatory focus. Spleen: No splenic lesions are evident. Adrenals/Urinary Tract: Adrenals appear normal bilaterally. There is no renal mass on either side. There is mild hydronephrosis on the left. There is no hydronephrosis on the right. On the right, there is a 1 mm calculus in the midportion of the kidney. There is no right-sided ureterectasis. On the left, there is a 5 x 2 mm calculus in the upper pole region, nonobstructing. There is a calculus in the proximal left ureter at the level of L3 measuring 7 x 5 mm. No other ureteral calculus is evident. The urinary bladder wall is borderline thickened. Stomach/Bowel: There is no appreciable bowel wall or mesenteric thickening. No bowel obstruction. No free air or portal venous air. Vascular/Lymphatic: There  is no abdominal aortic aneurysm. No vascular lesions are evident on this noncontrast enhanced study. There is no evident adenopathy in the abdomen or pelvis. Reproductive: There are a few small prostatic calculi. Prostate and seminal vesicles are normal in size and contour. No pelvic mass. Other: Appendix appears normal. There is no ascites or abscess in the abdomen or pelvis. There is a minimal ventral hernia containing only fat. Musculoskeletal: There are no blastic or lytic bone lesions. There is no intramuscular or abdominal wall lesion. IMPRESSION: 7 x 5 mm calculus in the proximal left ureter at the level of L3 with mild hydronephrosis on the left. There are nonobstructing calculi in each kidney. No hydronephrosis or ureteral calculus evident on the right. Bowel wall thickness in the urinary bladder. Suspect a degree of cystitis. There are a few small prostatic calculi. No bowel obstruction.  No abscess.  Appendix appears normal. Minimal ventral hernia containing only fat. Electronically Signed   By: Bretta Bang III  M.D.   On: 06/28/2016 09:17    Procedures Procedures (including critical care time)  Medications Ordered in ED Medications  0.9 %  sodium chloride infusion (not administered)  sodium chloride 0.9 % bolus 1,000 mL (1,000 mLs Intravenous New Bag/Given 06/28/16 0911)  ondansetron (ZOFRAN) injection 4 mg (4 mg Intravenous Given 06/28/16 0912)  ketorolac (TORADOL) 15 MG/ML injection 15 mg (15 mg Intravenous Given 06/28/16 0912)     Initial Impression / Assessment and Plan / ED Course  I have reviewed the triage vital signs and the nursing notes.  Pertinent labs & imaging results that were available during my care of the patient were reviewed by me and considered in my medical decision making (see chart for details).     Patient history kidney stones. CT scan confirms a left proximal ureteral stone. Measuring 5 x 7 mm. Also associated with hematuria as expected. Renal function is  normal. Patient in no acute distress. Improves some with Toradol. Patient does not have a urologist. Will give urology referral and treat symptomatically.  Final Clinical Impressions(s) / ED Diagnoses   Final diagnoses:  Left ureteral stone    New Prescriptions New Prescriptions   HYDROCODONE-ACETAMINOPHEN (NORCO/VICODIN) 5-325 MG TABLET    Take 1-2 tablets by mouth every 6 (six) hours as needed for moderate pain.   NAPROXEN (NAPROSYN) 500 MG TABLET    Take 1 tablet (500 mg total) by mouth 2 (two) times daily.   ONDANSETRON (ZOFRAN ODT) 4 MG DISINTEGRATING TABLET    Take 1 tablet (4 mg total) by mouth every 8 (eight) hours as needed for nausea or vomiting.     Vanetta MuldersZackowski, Indianna Boran, MD 06/28/16 [phone]

## 2016-07-02 ENCOUNTER — Ambulatory Visit (HOSPITAL_COMMUNITY)
Admission: RE | Admit: 2016-07-02 | Discharge: 2016-07-02 | Disposition: A | Discharge status: Home / Self Care | Payer: 59 | Source: Ambulatory Visit | Attending: Urology | Admitting: Urology

## 2016-07-02 ENCOUNTER — Other Ambulatory Visit: Payer: Self-pay | Admitting: Urology

## 2016-07-02 ENCOUNTER — Encounter (HOSPITAL_COMMUNITY): Payer: Self-pay | Admitting: *Deleted

## 2016-07-02 ENCOUNTER — Ambulatory Visit (INDEPENDENT_AMBULATORY_CARE_PROVIDER_SITE_OTHER): Payer: 59 | Admitting: Urology

## 2016-07-02 DIAGNOSIS — N201 Calculus of ureter: Secondary | ICD-10-CM

## 2016-07-02 DIAGNOSIS — N202 Calculus of kidney with calculus of ureter: Secondary | ICD-10-CM

## 2016-07-03 ENCOUNTER — Encounter (HOSPITAL_COMMUNITY)
Admission: RE | Admit: 2016-07-03 | Discharge: 2016-07-03 | Disposition: A | Discharge status: Home / Self Care | Payer: 59 | Source: Ambulatory Visit | Attending: Urology | Admitting: Urology

## 2016-07-03 ENCOUNTER — Other Ambulatory Visit: Payer: Self-pay

## 2016-07-03 DIAGNOSIS — Z87891 Personal history of nicotine dependence: Secondary | ICD-10-CM | POA: Diagnosis not present

## 2016-07-03 DIAGNOSIS — N201 Calculus of ureter: Secondary | ICD-10-CM | POA: Diagnosis not present

## 2016-07-03 DIAGNOSIS — I499 Cardiac arrhythmia, unspecified: Secondary | ICD-10-CM | POA: Diagnosis not present

## 2016-07-03 DIAGNOSIS — Z79899 Other long term (current) drug therapy: Secondary | ICD-10-CM | POA: Diagnosis not present

## 2016-07-04 ENCOUNTER — Ambulatory Visit (HOSPITAL_COMMUNITY)
Admission: RE | Admit: 2016-07-04 | Discharge: 2016-07-04 | Disposition: A | Discharge status: Home / Self Care | Payer: 59 | Source: Ambulatory Visit | Attending: Urology | Admitting: Urology

## 2016-07-04 ENCOUNTER — Encounter (HOSPITAL_COMMUNITY): Payer: Self-pay | Admitting: General Practice

## 2016-07-04 ENCOUNTER — Encounter (HOSPITAL_COMMUNITY)
Admission: RE | Disposition: A | Discharge status: Home / Self Care | Payer: Self-pay | Source: Ambulatory Visit | Attending: Urology

## 2016-07-04 ENCOUNTER — Ambulatory Visit (HOSPITAL_COMMUNITY): Payer: 59

## 2016-07-04 DIAGNOSIS — Z79899 Other long term (current) drug therapy: Secondary | ICD-10-CM | POA: Insufficient documentation

## 2016-07-04 DIAGNOSIS — I499 Cardiac arrhythmia, unspecified: Secondary | ICD-10-CM | POA: Insufficient documentation

## 2016-07-04 DIAGNOSIS — N201 Calculus of ureter: Secondary | ICD-10-CM

## 2016-07-04 DIAGNOSIS — Z87891 Personal history of nicotine dependence: Secondary | ICD-10-CM | POA: Insufficient documentation

## 2016-07-04 HISTORY — PX: EXTRACORPOREAL SHOCK WAVE LITHOTRIPSY: SHX1557

## 2016-07-04 HISTORY — DX: Personal history of urinary calculi: Z87.442

## 2016-07-04 SURGERY — LITHOTRIPSY, ESWL
Anesthesia: LOCAL | Laterality: Left

## 2016-07-04 MED ORDER — LEVOFLOXACIN 500 MG PO TABS
500.0000 mg | ORAL_TABLET | ORAL | Status: AC
Start: 2016-07-04 — End: 2016-07-04
  Administered 2016-07-04: 500 mg via ORAL
  Filled 2016-07-04: qty 1

## 2016-07-04 MED ORDER — SODIUM CHLORIDE 0.9 % IV SOLN
INTRAVENOUS | Status: DC
  Administered 2016-07-04: 07:00:00 via INTRAVENOUS

## 2016-07-04 MED ORDER — DIPHENHYDRAMINE HCL 25 MG PO CAPS
25.0000 mg | ORAL_CAPSULE | ORAL | Status: AC
  Administered 2016-07-04: 25 mg via ORAL
  Filled 2016-07-04: qty 1

## 2016-07-04 MED ORDER — HYDROMORPHONE HCL 1 MG/ML IJ SOLN
0.5000 mg | INTRAMUSCULAR | Status: AC
  Administered 2016-07-04: 0.5 mg via INTRAVENOUS
  Filled 2016-07-04: qty 0.5
  Filled 2016-07-04: qty 1

## 2016-07-04 MED ORDER — DIAZEPAM 5 MG PO TABS
10.0000 mg | ORAL_TABLET | ORAL | Status: AC
  Administered 2016-07-04: 10 mg via ORAL
  Filled 2016-07-04: qty 2

## 2016-07-04 MED ORDER — KETOROLAC TROMETHAMINE 30 MG/ML IJ SOLN
15.0000 mg | INTRAMUSCULAR | Status: AC
  Administered 2016-07-04: 15 mg via INTRAVENOUS
  Filled 2016-07-04 (×2): qty 1

## 2016-07-04 NOTE — Interval H&P Note (Signed)
History and Physical Interval Note:  07/04/2016 6:56 AM  Parker Carey  has presented today for surgery, with the diagnosis of LEFT URETERAL STONE  The various methods of treatment have been discussed with the patient and family. After consideration of risks, benefits and other options for treatment, the patient has consented to  Procedure(s): LEFT EXTRACORPOREAL SHOCK WAVE LITHOTRIPSY (ESWL) (Left) as a surgical intervention .  The patient's history has been reviewed, patient examined, no change in status, stable for surgery.  I have reviewed the patient's chart and labs.  Questions were answered to the patient's satisfaction.     Berniece SalinesHERRICK, BENJAMIN W

## 2016-07-04 NOTE — H&P (Signed)
Parker Carey is a 31 year-old male patient who is here for ureteral stone.  The problem is on the left side. This is not his first kidney stone. He is currently having back pain and nausea. He denies having flank pain, groin pain, vomiting, fever, and chills. Pain is occuring on the left side.   31 year old male with left proximal ureteral stone. Became symptomatic 4 days ago. CT of the hospital here revealed a 5 x 7 mm left proximal ureteral stone as well as smaller right and left renal calculi. He is past 3-4 stones before and no urologic evaluation in the past.   No complicating factors i.e. vomiting, fever.     ALLERGIES: None   MEDICATIONS: Hydrocodone-Acetaminophen 5 mg-325 mg tablet 1 tablet PO Daily  Naproxen 500 mg tablet 1 tablet PO Daily  Zofran 4 mg tablet 1 tablet PO Daily     GU PSH: None     PSH Notes: catheter ablation 2016   NON-GU PSH: Leg surgery (unspecified) - 2005    GU PMH: None   NON-GU PMH: None   FAMILY HISTORY: None   SOCIAL HISTORY: Marital Status: Married Current Smoking Status: Patient does not smoke anymore. Has not smoked since 06/12/2014. Smoked for 6 years. Smoked less than 1/2 pack per day.   Tobacco Use Assessment Completed: Used Tobacco in last 30 days? Drinks 1 drink per day. Light Drinker.  Drinks 1 caffeinated drink per day. 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    REVIEW OF SYSTEMS:    GU Review Male:   hematuria. Patient reports hard to postpone urination and burning/ pain with urination. Patient denies frequent urination, get up at night to urinate, leakage of urine, stream starts and stops, trouble starting your stream, have to strain to urinate , erection problems, and penile pain.  Gastrointestinal (Upper):   Patient denies nausea, vomiting, and indigestion/ heartburn.  Gastrointestinal (Lower):   Patient denies diarrhea and constipation.  Constitutional:   Patient denies fever, night sweats, weight loss, and fatigue.  Skin:    Patient denies skin rash/ lesion and itching.  Eyes:   Patient denies double vision and blurred vision.  Ears/ Nose/ Throat:   Patient denies sore throat and sinus problems.  Hematologic/Lymphatic:   Patient denies swollen glands and easy bruising.  Cardiovascular:   Patient denies leg swelling and chest pains.  Respiratory:   Patient denies cough and shortness of breath.  Endocrine:   Patient denies excessive thirst.  Musculoskeletal:   Patient denies back pain and joint pain.  Neurological:   Patient denies headaches and dizziness.  Psychologic:   Patient denies depression and anxiety.   VITAL SIGNS:      07/02/2016 09:10 AM  Weight 165 lb / 74.84 kg  Height 68 in / 172.72 cm  BP 116/78 mmHg  Pulse 87 /min  Temperature 97.9 F / 37 C  BMI 25.1 kg/m   MULTI-SYSTEM PHYSICAL EXAMINATION:    Constitutional: Well-nourished. No physical deformities. Normally developed. Good grooming.  Neck: Neck symmetrical, not swollen. Normal tracheal position.  Respiratory: No labored breathing, no use of accessory muscles.   Skin: No paleness, no jaundice, no cyanosis. No lesion, no ulcer, no rash.  Neurologic / Psychiatric: Oriented to time, oriented to place, oriented to person. No depression, no anxiety, no agitation.  Eyes: Normal conjunctivae. Normal eyelids.  Ears, Nose, Mouth, and Throat: Left ear no scars, no lesions, no masses. Right ear no scars, no lesions, no masses. Nose no scars, no lesions, no  masses. Normal hearing. Normal lips.  Musculoskeletal: Normal gait and station of head and neck.     PAST DATA REVIEWED:  Source Of History:  Patient  Records Review:   Previous Hospital Records  Urine Test Review:   Urinalysis  X-Ray Review: [facility] Protocol: Reviewed Films. Reviewed Report. Discussed With Patient. Hounsfield unit of proximal left ureteral stone and 1050. Skin to stone distance under 12 cm.    PROCEDURES:          Urinalysis - 81003 Dipstick Dipstick Cont'd   Specimen: Voided Bilirubin: Neg  Color: Yellow Ketones: Neg  Appearance: Clear Blood: 3+  Specific Gravity: 1.025 Protein: Neg  pH: 5.0 Urobilinogen: 0.2  Glucose: Neg Nitrites: Neg    Leukocyte Esterase: Trace    ASSESSMENT:      ICD-10 Details  1 GU:   Renal and ureteral calculus - N20.2 5 x 7 mm left proximal ureteral stone with smaller bilateral renal calculi   PLAN:            Medications New Meds: Flomax 0.4 mg capsule, ext release 24 hr 1 capsule PO Daily   #30  11 Refill(s)  Oxycodone Hcl 5 mg tablet 1 tablet PO Q 4 Hwhen necessary pain   #30  0 Refill(s)            Orders X-Rays: KUB          Schedule Return Visit/Planned Activity: Return PRN - Office Visit             Note: I will call with KUB results and follow-up.

## 2016-07-04 NOTE — Op Note (Signed)
See [facility] OP note scanned into chart. Also because of the size, density, location and other factors that cannot be anticipated I feel this will likely be a staged procedure. This fact supersedes any indication in the scanned [facility] operative note to the contrary.  

## 2016-07-04 NOTE — Discharge Instructions (Signed)
See [facility] discharge instructions in chart.  

## 2016-07-05 ENCOUNTER — Encounter (HOSPITAL_COMMUNITY): Payer: Self-pay | Admitting: Urology

## 2016-08-05 ENCOUNTER — Other Ambulatory Visit: Payer: Self-pay | Admitting: Urology

## 2016-08-05 ENCOUNTER — Ambulatory Visit (HOSPITAL_COMMUNITY)
Admission: RE | Admit: 2016-08-05 | Discharge: 2016-08-05 | Disposition: A | Discharge status: Home / Self Care | Payer: 59 | Source: Ambulatory Visit | Attending: Urology | Admitting: Urology

## 2016-08-05 DIAGNOSIS — N202 Calculus of kidney with calculus of ureter: Secondary | ICD-10-CM | POA: Diagnosis not present

## 2016-08-06 ENCOUNTER — Other Ambulatory Visit (HOSPITAL_COMMUNITY)
Admission: RE | Admit: 2016-08-06 | Discharge: 2016-08-06 | Disposition: A | Discharge status: Home / Self Care | Payer: 59 | Source: Ambulatory Visit | Attending: Urology | Admitting: Urology

## 2016-08-06 ENCOUNTER — Ambulatory Visit (INDEPENDENT_AMBULATORY_CARE_PROVIDER_SITE_OTHER): Payer: Self-pay | Admitting: Urology

## 2016-08-06 DIAGNOSIS — N201 Calculus of ureter: Secondary | ICD-10-CM

## 2016-08-06 DIAGNOSIS — N202 Calculus of kidney with calculus of ureter: Secondary | ICD-10-CM | POA: Diagnosis not present

## 2016-08-12 ENCOUNTER — Emergency Department (HOSPITAL_BASED_OUTPATIENT_CLINIC_OR_DEPARTMENT_OTHER): Payer: 59

## 2016-08-12 ENCOUNTER — Emergency Department (HOSPITAL_BASED_OUTPATIENT_CLINIC_OR_DEPARTMENT_OTHER)
Admission: EM | Admit: 2016-08-12 | Discharge: 2016-08-12 | Disposition: A | Discharge status: Home / Self Care | Payer: 59 | Attending: Emergency Medicine | Admitting: Emergency Medicine

## 2016-08-12 ENCOUNTER — Encounter (HOSPITAL_BASED_OUTPATIENT_CLINIC_OR_DEPARTMENT_OTHER): Payer: Self-pay | Admitting: Emergency Medicine

## 2016-08-12 DIAGNOSIS — W231XXA Caught, crushed, jammed, or pinched between stationary objects, initial encounter: Secondary | ICD-10-CM | POA: Diagnosis not present

## 2016-08-12 DIAGNOSIS — F17220 Nicotine dependence, chewing tobacco, uncomplicated: Secondary | ICD-10-CM | POA: Diagnosis not present

## 2016-08-12 DIAGNOSIS — Y939 Activity, unspecified: Secondary | ICD-10-CM | POA: Insufficient documentation

## 2016-08-12 DIAGNOSIS — S60946A Unspecified superficial injury of right little finger, initial encounter: Secondary | ICD-10-CM | POA: Diagnosis present

## 2016-08-12 DIAGNOSIS — S6710XA Crushing injury of unspecified finger(s), initial encounter: Secondary | ICD-10-CM

## 2016-08-12 DIAGNOSIS — Z79899 Other long term (current) drug therapy: Secondary | ICD-10-CM | POA: Insufficient documentation

## 2016-08-12 DIAGNOSIS — S67196A Crushing injury of right little finger, initial encounter: Secondary | ICD-10-CM | POA: Insufficient documentation

## 2016-08-12 DIAGNOSIS — Z23 Encounter for immunization: Secondary | ICD-10-CM | POA: Diagnosis not present

## 2016-08-12 DIAGNOSIS — Y999 Unspecified external cause status: Secondary | ICD-10-CM | POA: Diagnosis not present

## 2016-08-12 DIAGNOSIS — Y929 Unspecified place or not applicable: Secondary | ICD-10-CM | POA: Insufficient documentation

## 2016-08-12 MED ORDER — TETANUS-DIPHTH-ACELL PERTUSSIS 5-2.5-18.5 LF-MCG/0.5 IM SUSP
0.5000 mL | Freq: Once | INTRAMUSCULAR | Status: AC
  Administered 2016-08-12: 0.5 mL via INTRAMUSCULAR
  Filled 2016-08-12: qty 0.5

## 2016-08-12 NOTE — Discharge Instructions (Signed)
Take Tylenol as needed for pain. Wash your finger daily with soap and water and place a thin layer of bacitracin ointment over the wound and cover with a sterile bandage. Signs of infection including redness around the wound, drainage from the wound, or pain or fever. Return or see an urgent care center or your primary care physician if you think he may be developing infection. Your finger nail may not grow back or may grow back with a different appearance than original

## 2016-08-12 NOTE — ED Triage Notes (Addendum)
Reports ripping left little fingernail off while working on a car yesterday.  Bleeding controlled at present.

## 2016-08-12 NOTE — ED Provider Notes (Signed)
[facility] Provider Note   CSN: [id number] Arrival date & time: 08/12/16  0955     History   Chief Complaint Chief Complaint  Patient presents with  . Finger Injury    HPI Parker Carey is a 31 y.o. male.Patient reports that a transmission fell on his right fifth fingertip yesterday which caused his right fifth finger nail to come off. He complains of pain at distal phalanx of right finger. Pain is mild. He treated himself with an oxycodone tablet today prior to coming here no other injury. No other associated symptoms.  HPI  Past Medical History:  Diagnosis Date  . History of kidney stones   . Kidney stones   . SVT (supraventricular tachycardia) [facility](HCC)     Patient Active Problem List   Diagnosis Date Noted  . SVT (supraventricular tachycardia) (HCC) 05/20/2014    Past Surgical History:  Procedure Laterality Date  . EXTRACORPOREAL SHOCK WAVE LITHOTRIPSY Left 07/04/2016   Procedure: LEFT EXTRACORPOREAL SHOCK WAVE LITHOTRIPSY (ESWL);  Surgeon: Crist FatHerrick, Benjamin W, MD;  Location: [facility];  Service: Urology;  Laterality: Left;  . FRACTURE SURGERY    . ORIF TIBIA & FIBULA FRACTURES Right 2005  . SUPRAVENTRICULAR TACHYCARDIA ABLATION  05/20/2014  . SUPRAVENTRICULAR TACHYCARDIA ABLATION N/A 05/20/2014   Procedure: SUPRAVENTRICULAR TACHYCARDIA ABLATION;  Surgeon: Hillis RangeJames Allred, MD;  [facility];  Service: Cardiovascular;  Laterality: N/A;       Home Medications    Prior to Admission medications   Medication Sig Start Date End Date Taking? Authorizing Provider  naproxen (NAPROSYN) 500 MG tablet Take 1 tablet (500 mg total) by mouth 2 (two) times daily. 06/28/16   Vanetta MuldersZackowski, Scott, MD  ondansetron (ZOFRAN ODT) 4 MG disintegrating tablet Take 1 tablet (4 mg total) by mouth every 8 (eight) hours as needed for nausea or vomiting. 06/28/16   Vanetta MuldersZackowski, Scott, MD  oxycodone (OXY-IR) 5 MG capsule Take 5 mg by mouth every 4 (four) hours as needed. 07/02/16    [provider]  tamsulosin (FLOMAX) 0.4 MG CAPS capsule Take 0.4 mg by mouth daily.    [provider]    Family History Family History  Problem Relation Age of Onset  . Cancer Maternal Grandmother     Social History Social History  Substance Use Topics  . Smoking status: Current Some Day Smoker    Years: 5.00    Types: Cigars  . Smokeless tobacco: Current User    Types: Chew  . Alcohol use 3.6 oz/week    6 Cans of beer per week     Comment: occas     Allergies   Patient has no known allergies.   Review of Systems Review of Systems  Constitutional: Negative.   Musculoskeletal: Positive for arthralgias.       Pain at right fifth finger  Skin: Positive for wound.       Avulsed fifth fingernail  Allergic/Immunologic:       Not current on tetanus immunization     Physical Exam Updated Vital Signs BP 129/83 (BP Location: Left Arm)   Pulse 80   Temp 98.6 F (37 C) (Oral)   Resp 18   Ht 5\' 8"  (1.727 m)   Wt 74.8 kg (165 lb)   SpO2 96%   BMI 25.09 kg/m   Physical Exam  Constitutional: He appears well-developed and well-nourished.  HENT:  Head: Normocephalic and atraumatic.  Eyes: EOM are normal.  Neck: Neck supple.  Cardiovascular: Normal rate.  Pulmonary/Chest: Effort normal.  Abdominal: He exhibits no distension.  Musculoskeletal:  Right upper extremity fifth finger nail is missing. No active bleeding deformity. No mallet deformity or boutonniere deformity. Tender at fifth phalanx. Good capillary refill. All other extremities without deformity swelling or tenderness neurovascular intact  Nursing note and vitals reviewed.    ED Treatments / Results  Labs (all labs ordered are listed, but only abnormal results are displayed) Labs Reviewed - No data to display  EKG  EKG Interpretation None      X-ray viewed by me. X-rays of right fifth finger, not index finger  Results for orders placed or performed during the hospital  encounter of 06/28/16  Urinalysis, Routine w reflex microscopic- may I&O cath if menses  Result Value Ref Range   Color, Urine RED (A) YELLOW   APPearance CLOUDY (A) CLEAR   Specific Gravity, Urine 1.011 1.005 - 1.030   pH 5.5 5.0 - 8.0   Glucose, UA NEGATIVE NEGATIVE mg/dL   Hgb urine dipstick LARGE (A) NEGATIVE   Bilirubin Urine NEGATIVE NEGATIVE   Ketones, ur NEGATIVE NEGATIVE mg/dL   Protein, ur 30 (A) NEGATIVE mg/dL   Nitrite NEGATIVE NEGATIVE   Leukocytes, UA TRACE (A) NEGATIVE  CBC  Result Value Ref Range   WBC 8.5 4.0 - 10.5 K/uL   RBC 5.03 4.22 - 5.81 MIL/uL   Hemoglobin 15.8 13.0 - 17.0 g/dL   HCT 16.1 09.6 - 04.5 %   MCV 90.5 78.0 - 100.0 fL   MCH 31.4 26.0 - 34.0 pg   MCHC 34.7 30.0 - 36.0 g/dL   RDW 40.9 81.1 - 91.4 %   Platelets 227 150 - 400 K/uL  Comprehensive metabolic panel  Result Value Ref Range   Sodium 137 135 - 145 mmol/L   Potassium 3.6 3.5 - 5.1 mmol/L   Chloride 103 101 - 111 mmol/L   CO2 25 22 - 32 mmol/L   Glucose, Bld 97 65 - 99 mg/dL   BUN 9 6 - 20 mg/dL   Creatinine, Ser 7.82 0.61 - 1.24 mg/dL   Calcium 9.0 8.9 - 95.6 mg/dL   Total Protein 7.3 6.5 - 8.1 g/dL   Albumin 4.6 3.5 - 5.0 g/dL   AST 22 15 - 41 U/L   ALT 19 17 - 63 U/L   Alkaline Phosphatase 76 38 - 126 U/L   Total Bilirubin 0.7 0.3 - 1.2 mg/dL   GFR calc non Af Amer >60 >60 mL/min   GFR calc Af Amer >60 >60 mL/min   Anion gap 9 5 - 15  Urinalysis, Microscopic (reflex)  Result Value Ref Range   RBC / HPF TOO NUMEROUS TO COUNT 0 - 5 RBC/hpf   WBC, UA 0-5 0 - 5 WBC/hpf   Bacteria, UA MANY (A) NONE SEEN   Squamous Epithelial / LPF 0-5 (A) NONE SEEN   Mucous PRESENT    Dg Abd 1 View  Result Date: 08/05/2016 CLINICAL DATA:  History of left ureteral stone. EXAM: ABDOMEN - 1 VIEW COMPARISON:  07/04/2016.  CT 06/28/2016. FINDINGS: The previously seen proximal left ureteral stone now all may have migrated into the mid left ureter near the pelvic brim. There appears to be a similarly  sized calcification just inferior to the left L5 transverse process. Calcification over the upper pole of the left kidney and midpole of the right kidney. Calcified phleboliths in the right pelvis. IMPRESSION: Previously seen proximal left ureteral stone may now be located inferior to the left  L5 transverse process near the pelvic brim. Bilateral nephrolithiasis. Electronically Signed   By: Charlett Nose M.D.   On: 08/05/2016 15:17   Dg Finger Index Right  Result Date: 08/12/2016 CLINICAL DATA:  Crush injury distal fifth finger, initial encounter. EXAM: RIGHT INDEX FINGER 2+V COMPARISON:  None. FINDINGS: No acute osseous or joint abnormality.  No radiopaque foreign body. IMPRESSION: No acute osseous abnormality. Electronically Signed   By: Leanna Battles M.D.   On: 08/12/2016 10:49   Radiology No results found.  Procedures Procedures (including critical care time)  Medications Ordered in ED Medications  Tdap (BOOSTRIX) injection 0.5 mL (not administered)     Initial Impression / Assessment and Plan / ED Course  I have reviewed the triage vital signs and the nursing notes.  Pertinent labs & imaging results that were available during my care of the patient were reviewed by me and considered in my medical decision making (see chart for details).     I've explained to patient that his nail may not grow back or may grow back deformed fashion Plan local wound care. Patient was dressed with bacitracin ointment with sterile dressing. tdap  updated Tylenol for pain. Final Clinical Impressions(s) / ED Diagnoses  Diagnosis crush injury to right fifth finger Final diagnoses:  None    New Prescriptions New Prescriptions   No medications on file     Doug Sou, MD 08/12/16 1117

## 2016-08-15 LAB — STONE ANALYSIS
CA OXALATE, MONOHYDR.: 65 %
CA PHOS CRY STONE QL IR: 15 %
Ca Oxalate,Dihydrate: 20 %
Stone Weight KSTONE: 14 mg

## 2017-04-02 ENCOUNTER — Other Ambulatory Visit: Payer: Self-pay

## 2017-04-02 ENCOUNTER — Ambulatory Visit (INDEPENDENT_AMBULATORY_CARE_PROVIDER_SITE_OTHER): Payer: 59 | Admitting: Physician Assistant

## 2017-04-02 ENCOUNTER — Encounter: Payer: Self-pay | Admitting: Physician Assistant

## 2017-04-02 VITALS — BP 100/80 | HR 69 | Temp 98.1°F | Resp 14 | Ht 68.0 in | Wt 179.0 lb

## 2017-04-02 DIAGNOSIS — Z0001 Encounter for general adult medical examination with abnormal findings: Secondary | ICD-10-CM | POA: Diagnosis not present

## 2017-04-02 DIAGNOSIS — Z0000 Encounter for general adult medical examination without abnormal findings: Secondary | ICD-10-CM

## 2017-04-02 DIAGNOSIS — R5382 Chronic fatigue, unspecified: Secondary | ICD-10-CM | POA: Diagnosis not present

## 2017-04-02 LAB — COMPREHENSIVE METABOLIC PANEL
ALK PHOS: 71 U/L (ref 39–117)
ALT: 26 U/L (ref 0–53)
AST: 22 U/L (ref 0–37)
Albumin: 4.5 g/dL (ref 3.5–5.2)
BUN: 13 mg/dL (ref 6–23)
CO2: 31 mEq/L (ref 19–32)
Calcium: 10 mg/dL (ref 8.4–10.5)
Chloride: 98 mEq/L (ref 96–112)
Creatinine, Ser: 0.78 mg/dL (ref 0.40–1.50)
GFR: 121.56 mL/min (ref >60.00)
GLUCOSE: 82 mg/dL (ref 70–99)
POTASSIUM: 4.5 meq/L (ref 3.5–5.1)
Sodium: 136 mEq/L (ref 135–145)
TOTAL PROTEIN: 7.4 g/dL (ref 6.0–8.3)
Total Bilirubin: 0.7 mg/dL (ref 0.2–1.2)

## 2017-04-02 LAB — CBC WITH DIFFERENTIAL/PLATELET
BASOS ABS: 0 10*3/uL (ref 0.0–0.1)
Basophils Relative: 0.6 % (ref 0.0–3.0)
EOS ABS: 0.2 10*3/uL (ref 0.0–0.7)
Eosinophils Relative: 2 % (ref 0.0–5.0)
HCT: 45.2 % (ref 39.0–52.0)
Hemoglobin: 15.3 g/dL (ref 13.0–17.0)
LYMPHS ABS: 2 10*3/uL (ref 0.7–4.0)
Lymphocytes Relative: 26.1 % (ref 12.0–46.0)
MCHC: 33.8 g/dL (ref 30.0–36.0)
MCV: 89.9 fl (ref 78.0–100.0)
MONO ABS: 0.6 10*3/uL (ref 0.1–1.0)
Monocytes Relative: 7.6 % (ref 3.0–12.0)
NEUTROS PCT: 63.7 % (ref 43.0–77.0)
Neutro Abs: 4.8 10*3/uL (ref 1.4–7.7)
Platelets: 268 10*3/uL (ref 150.0–400.0)
RBC: 5.03 Mil/uL (ref 4.22–5.81)
RDW: 13.1 % (ref 11.5–15.5)
WBC: 7.5 10*3/uL (ref 4.0–10.5)

## 2017-04-02 LAB — LIPID PANEL
CHOL/HDL RATIO: 5
Cholesterol: 217 mg/dL — ABNORMAL HIGH (ref 0–200)
HDL: 41.3 mg/dL (ref >39.00)
Triglycerides: 425 mg/dL — ABNORMAL HIGH (ref 0.0–149.0)

## 2017-04-02 LAB — LDL CHOLESTEROL, DIRECT: LDL DIRECT: 119 mg/dL

## 2017-04-02 LAB — VITAMIN D 25 HYDROXY (VIT D DEFICIENCY, FRACTURES): VITD: 21.37 ng/mL — ABNORMAL LOW (ref 30.00–100.00)

## 2017-04-02 LAB — TSH: TSH: 1.75 u[IU]/mL (ref 0.35–4.50)

## 2017-04-02 LAB — TESTOSTERONE: TESTOSTERONE: 302.04 ng/dL (ref 300.00–890.00)

## 2017-04-02 NOTE — Patient Instructions (Signed)
Please go to the lab for blood work.   Our office will call you with your results unless you have chosen to receive results via MyChart.  If your blood work is normal we will follow-up each year for physicals and as scheduled for chronic medical problems.  If anything is abnormal we will treat accordingly and get you in for a follow-up.   Preventive Care 34-39 Years, Male Preventive care refers to lifestyle choices and visits with your health care provider that can promote health and wellness. What does preventive care include?  A yearly physical exam. This is also called an annual well check.  Dental exams once or twice a year.  Routine eye exams. Ask your health care provider how often you should have your eyes checked.  Personal lifestyle choices, including: ? Daily care of your teeth and gums. ? Regular physical activity. ? Eating a healthy diet. ? Avoiding tobacco and drug use. ? Limiting alcohol use. ? Practicing safe sex. What happens during an annual well check? The services and screenings done by your health care provider during your annual well check will depend on your age, overall health, lifestyle risk factors, and family history of disease. Counseling Your health care provider may ask you questions about your:  Alcohol use.  Tobacco use.  Drug use.  Emotional well-being.  Home and relationship well-being.  Sexual activity.  Eating habits.  Work and work Statistician.  Screening You may have the following tests or measurements:  Height, weight, and BMI.  Blood pressure.  Lipid and cholesterol levels. These may be checked every 5 years starting at age 67.  Diabetes screening. This is done by checking your blood sugar (glucose) after you have not eaten for a while (fasting).  Skin check.  Hepatitis C blood test.  Hepatitis B blood test.  Sexually transmitted disease (STD) testing.  Discuss your test results, treatment options, and if  necessary, the need for more tests with your health care provider. Vaccines Your health care provider may recommend certain vaccines, such as:  Influenza vaccine. This is recommended every year.  Tetanus, diphtheria, and acellular pertussis (Tdap, Td) vaccine. You may need a Td booster every 34 years.  Varicella vaccine. You may need this if you have not been vaccinated.  HPV vaccine. If you are 21 or younger, you may need three doses over 34 months.  Measles, mumps, and rubella (MMR) vaccine. You may need at least 34 dose of MMR.You may also need a second dose.  Pneumococcal 13-valent conjugate (PCV13) vaccine. You may need this if you have certain conditions and have not been vaccinated.  Pneumococcal polysaccharide (PPSV23) vaccine. You may need one or two doses if you smoke cigarettes or if you have certain conditions.  Meningococcal vaccine. One dose is recommended if you are age 34-21 years and a first-year college student living in a residence hall, or if you have one of several medical conditions. You may also need additional booster doses.  Hepatitis A vaccine. You may need this if you have certain conditions or if you travel or work in places where you may be exposed to hepatitis A.  Hepatitis B vaccine. You may need this if you have certain conditions or if you travel or work in places where you may be exposed to hepatitis B.  Haemophilus influenzae type b (Hib) vaccine. You may need this if you have certain risk factors.  Talk to your health care provider about which screenings and vaccines you need and  how often you need them. This information is not intended to replace advice given to you by your health care provider. Make sure you discuss any questions you have with your health care provider. Document Released: 03/26/2001 Document Revised: 10/18/2015 Document Reviewed: 11/29/2014 Elsevier Interactive Patient Education  [employer].

## 2017-04-02 NOTE — Assessment & Plan Note (Signed)
Depression screen negative. Health Maintenance reviewed -- Declines flu shot and HIV screen. Tetanus up-to-date. Preventive schedule discussed and handout given in AVS. Will obtain fasting labs today.   

## 2017-04-02 NOTE — Progress Notes (Signed)
Patient presents to clinic today to establish care.  Patient is keeping a low-carb diet. Is eating a well-balanced diet and staying very active.   Acute Concerns: Over the past year or so, noting a steady weight gain, worse over the past 3-4 months. Notes this despite same diet and increasing aerobic activity. . Denies depressed mood, anhedonia or anxiety. Denies SI/HI. Has noted some fatigue and irritability. Is averaging about 6-8 hours of sleep per night. Notes restful sleep. Has also noted hair loss over the past few years and a significant decrease in libido over the past few years. Noting constipation. Denies melena, hematochezia or constipation. Denies change in function.    Chronic Issues: History of SVT -- s/p ablation in 2016. Is no longer followed by Cardiology. Patient denies chest pain, palpitations, lightheadedness, dizziness, vision changes or frequent headaches.  Former Tobacco user -- quit last summer (dip and cigarettes).   Health Maintenance: Immunizations -- Declines flu shot. Tetanus up-to-date. Declines HIV screen.   Past Medical History:  Diagnosis Date  . History of chickenpox   . History of kidney stones   . Kidney stones   . SVT (supraventricular tachycardia) (HCC)     Past Surgical History:  Procedure Laterality Date  . EXTRACORPOREAL SHOCK WAVE LITHOTRIPSY Left 07/04/2016   Procedure: LEFT EXTRACORPOREAL SHOCK WAVE LITHOTRIPSY (ESWL);  Surgeon: Crist FatHerrick, Benjamin W, MD;  Location: [facility];  Service: Urology;  Laterality: Left;  . FRACTURE SURGERY    . ORIF TIBIA & FIBULA FRACTURES Right 2005  . SUPRAVENTRICULAR TACHYCARDIA ABLATION  05/20/2014  . SUPRAVENTRICULAR TACHYCARDIA ABLATION N/A 05/20/2014   Procedure: SUPRAVENTRICULAR TACHYCARDIA ABLATION;  Surgeon: Hillis RangeJames Allred, MD;  [facility];  Service: Cardiovascular;  Laterality: N/A;    No current outpatient medications on file prior to visit.   No current facility-administered medications on  file prior to visit.     No Known Allergies  Family History  Problem Relation Age of Onset  . Healthy Mother   . Healthy Father   . Cancer Maternal Grandmother   . Hyperlipidemia Maternal Grandmother     Social History   Socioeconomic History  . Marital status: Married    Spouse name: Not on file  . Number of children: Not on file  . Years of education: Not on file  . Highest education level: Not on file  Social Needs  . Financial resource strain: Not on file  . Food insecurity - worry: Not on file  . Food insecurity - inability: Not on file  . Transportation needs - medical: Not on file  . Transportation needs - non-medical: Not on file  Occupational History  . Occupation: Plumber  Tobacco Use  . Smoking status: Former Smoker    Years: 9.00    Types: Cigarettes  . Smokeless tobacco: Former NeurosurgeonUser    Types: Chew  Substance and Sexual Activity  . Alcohol use: Yes    Alcohol/week: 3.6 oz    Types: 6 Cans of beer per week    Comment: occas  . Drug use: No  . Sexual activity: Yes  Other Topics Concern  . Not on file  Social History Narrative  . Not on file   Review of Systems  Constitutional: Positive for malaise/fatigue. Negative for fever and weight loss.  HENT: Negative for ear discharge, ear pain, hearing loss and tinnitus.   Eyes: Negative for blurred vision, double vision, photophobia and pain.  Respiratory: Negative for cough and shortness of breath.   Cardiovascular:  Negative for chest pain and palpitations.  Gastrointestinal: Positive for constipation. Negative for abdominal pain, blood in stool, diarrhea, heartburn, melena, nausea and vomiting.  Genitourinary: Negative for dysuria, flank pain, frequency, hematuria and urgency.       Decreased libido.  Musculoskeletal: Negative for falls.  Neurological: Negative for dizziness, loss of consciousness and headaches.  Endo/Heme/Allergies: Negative for environmental allergies.  Psychiatric/Behavioral: Negative  for depression, hallucinations, substance abuse and suicidal ideas. The patient is not nervous/anxious and does not have insomnia.    BP 100/80   Pulse 69   Temp 98.1 F (36.7 C) (Oral)   Resp 14   Ht 5\' 8"  (1.727 m)   Wt 179 lb (81.2 kg)   SpO2 98%   BMI 27.22 kg/m   Physical Exam  Constitutional: He is oriented to person, place, and time and well-developed, well-nourished, and in no distress.  HENT:  Head: Normocephalic and atraumatic.  Right Ear: External ear normal.  Left Ear: External ear normal.  Nose: Nose normal.  Mouth/Throat: Oropharynx is clear and moist. No oropharyngeal exudate.  Eyes: Conjunctivae and EOM are normal. Pupils are equal, round, and reactive to light.  Neck: Neck supple. No thyromegaly present.  Cardiovascular: Normal rate, regular rhythm, normal heart sounds and intact distal pulses.  Pulmonary/Chest: Effort normal and breath sounds normal. No respiratory distress. He has no wheezes. He has no rales. He exhibits no tenderness.  Abdominal: Soft. Bowel sounds are normal. He exhibits no distension and no mass. There is no tenderness. There is no rebound and no guarding.  Genitourinary: Testes/scrotum normal and penis normal. No discharge found.  Lymphadenopathy:    He has no cervical adenopathy.  Neurological: He is alert and oriented to person, place, and time.  Skin: Skin is warm and dry. No rash noted.  Psychiatric: Affect normal.  Vitals reviewed.  Assessment/Plan: Visit for preventive health examination Depression screen negative. Health Maintenance reviewed -- Declines flu shot and HIV screen. Tetanus up-to-date. Preventive schedule discussed and handout given in AVS. Will obtain fasting labs today.   Chronic fatigue Will check labs to include CBC, TSH, Vitamin D and Testosterone.    Piedad Climes, PA-C

## 2017-04-02 NOTE — Assessment & Plan Note (Signed)
Will check labs to include CBC, TSH, Vitamin D and Testosterone.

## 2017-04-03 ENCOUNTER — Other Ambulatory Visit: Payer: Self-pay | Admitting: Physician Assistant

## 2017-04-03 DIAGNOSIS — R7989 Other specified abnormal findings of blood chemistry: Secondary | ICD-10-CM

## 2017-04-03 DIAGNOSIS — E781 Pure hyperglyceridemia: Secondary | ICD-10-CM

## 2017-04-03 DIAGNOSIS — E559 Vitamin D deficiency, unspecified: Secondary | ICD-10-CM

## 2017-04-03 MED ORDER — VITAMIN D 50 MCG (2000 UT) PO TABS: 2000.0000 [IU] | ORAL_TABLET | Freq: Every day | ORAL | 0 refills | Status: AC

## 2017-04-03 MED ORDER — FISH OIL 1200 MG PO CAPS: 1.0000 | ORAL_CAPSULE | Freq: Every day | ORAL | 0 refills | Status: DC

## 2017-04-16 ENCOUNTER — Other Ambulatory Visit: Payer: Self-pay | Admitting: *Deleted

## 2017-04-16 ENCOUNTER — Other Ambulatory Visit (INDEPENDENT_AMBULATORY_CARE_PROVIDER_SITE_OTHER): Payer: 59

## 2017-04-16 DIAGNOSIS — E782 Mixed hyperlipidemia: Secondary | ICD-10-CM

## 2017-04-16 DIAGNOSIS — R7989 Other specified abnormal findings of blood chemistry: Secondary | ICD-10-CM

## 2017-04-16 DIAGNOSIS — E559 Vitamin D deficiency, unspecified: Secondary | ICD-10-CM

## 2017-04-16 NOTE — Addendum Note (Signed)
Addended by: Lenis DickinsonILLARD, BETHANY M on: 04/16/2017 08:10 AM   Modules accepted: Orders

## 2017-04-17 ENCOUNTER — Telehealth: Payer: Self-pay | Admitting: Physician Assistant

## 2017-04-17 LAB — TESTOSTERONE TOTAL,FREE,BIO, MALES
ALBUMIN MSPROF: 4.3 g/dL (ref 3.6–5.1)
Sex Hormone Binding: 12 nmol/L (ref 10–50)
TESTOSTERONE: 264 ng/dL (ref 250–827)
Testosterone, Bioavailable: 132.8 ng/dL (ref >=110.0)
Testosterone, Free: 67.4 pg/mL (ref 46.0–224.0)

## 2017-04-17 NOTE — Telephone Encounter (Signed)
Copied from CRM [phone]#[id number]. Topic: Quick Communication - See Telephone Encounter >> Apr 17, 2017  4:39 PM Arlyss Gandyichardson, Jameek Bruntz N, NT wrote: CRM for notification. See Telephone encounter for: Pt would like a call with his lab results.   04/17/17.

## 2017-04-18 NOTE — Telephone Encounter (Signed)
Calling patient today with results.  Will be documented under Lab results.

## 2017-07-17 ENCOUNTER — Encounter: Payer: Self-pay | Admitting: Physician Assistant

## 2017-07-17 ENCOUNTER — Other Ambulatory Visit: Payer: Self-pay

## 2017-07-17 ENCOUNTER — Ambulatory Visit (INDEPENDENT_AMBULATORY_CARE_PROVIDER_SITE_OTHER): Payer: 59 | Admitting: Physician Assistant

## 2017-07-17 VITALS — BP 116/80 | HR 92 | Temp 98.2°F | Resp 15 | Ht 68.0 in | Wt 166.8 lb

## 2017-07-17 DIAGNOSIS — J208 Acute bronchitis due to other specified organisms: Secondary | ICD-10-CM

## 2017-07-17 DIAGNOSIS — B9689 Other specified bacterial agents as the cause of diseases classified elsewhere: Secondary | ICD-10-CM | POA: Diagnosis not present

## 2017-07-17 MED ORDER — AZITHROMYCIN 250 MG PO TABS: ORAL_TABLET | ORAL | 0 refills | Status: DC

## 2017-07-17 MED ORDER — FLUTICASONE PROPIONATE 50 MCG/ACT NA SUSP: 2.0000 | Freq: Every day | NASAL | 1 refills | Status: DC

## 2017-07-17 NOTE — Progress Notes (Signed)
Patient presents to clinic today c/o chest congestion and cough that is productive of greenish-brown sputum. Has had intermittent fevers at 99-101. Denies chills. Denies sinus pain, ear pain. Notes significant pnd. Denies recent travel or sick contact. Has taken Tylenol, Dayquil and Nyquil.   Past Medical History:  Diagnosis Date  . History of chickenpox   . History of kidney stones   . Kidney stones   . SVT (supraventricular tachycardia) (HCC)     Current Outpatient Medications on File Prior to Visit  Medication Sig Dispense Refill  . anastrozole (ARIMIDEX) 1 MG tablet TK 1 T PO QD  5  . Cholecalciferol (VITAMIN D) 2000 units tablet Take 1 tablet (2,000 Units total) by mouth daily. 30 tablet 0  . Omega-3 Fatty Acids (FISH OIL) 1200 MG CAPS Take 1 capsule (1,200 mg total) by mouth daily. 30 capsule 0   No current facility-administered medications on file prior to visit.     No Known Allergies  Family History  Problem Relation Age of Onset  . Healthy Mother   . Healthy Father   . Cancer Maternal Grandmother   . Hyperlipidemia Maternal Grandmother     Social History   Socioeconomic History  . Marital status: Married    Spouse name: Not on file  . Number of children: Not on file  . Years of education: Not on file  . Highest education level: Not on file  Occupational History  . Occupation: Nutritional therapistlumber  Social Needs  . Financial resource strain: Not on file  . Food insecurity:    Worry: Not on file    Inability: Not on file  . Transportation needs:    Medical: Not on file    Non-medical: Not on file  Tobacco Use  . Smoking status: Former Smoker    Years: 9.00    Types: Cigarettes  . Smokeless tobacco: Former NeurosurgeonUser    Types: Chew  Substance and Sexual Activity  . Alcohol use: Yes    Alcohol/week: 3.6 oz    Types: 6 Cans of beer per week    Comment: occas  . Drug use: No  . Sexual activity: Yes  Lifestyle  . Physical activity:    Days per week: Not on file   Minutes per session: Not on file  . Stress: Not on file  Relationships  . Social connections:    Talks on phone: Not on file    Gets together: Not on file    Attends religious service: Not on file    Active member of club or organization: Not on file    Attends meetings of clubs or organizations: Not on file    Relationship status: Not on file  Other Topics Concern  . Not on file  Social History Narrative  . Not on file   Review of Systems - See HPI.  All other ROS are negative.  BP 116/80   Pulse 92   Temp 98.2 F (36.8 C) (Oral)   Resp 15   Ht 5\' 8"  (1.727 m)   Wt 166 lb 12.8 oz (75.7 kg)   SpO2 95%   BMI 25.36 kg/m   Physical Exam  Constitutional: He is oriented to person, place, and time. He appears well-developed and well-nourished.  HENT:  Head: Normocephalic and atraumatic.  Right Ear: External ear normal.  Left Ear: External ear normal.  Nose: Mucosal edema and rhinorrhea present. Right sinus exhibits no maxillary sinus tenderness and no frontal sinus tenderness. Left sinus exhibits no  maxillary sinus tenderness and no frontal sinus tenderness.  Mouth/Throat: Uvula is midline and oropharynx is clear and moist.  Eyes: Conjunctivae are normal.  Neck: Neck supple.  Cardiovascular: Normal rate, regular rhythm, normal heart sounds and intact distal pulses.  Pulmonary/Chest: Effort normal and breath sounds normal. No stridor. No respiratory distress. He has no wheezes. He has no rales. He exhibits no tenderness.  Neurological: He is alert and oriented to person, place, and time.  Vitals reviewed.  Assessment/Plan: 1. Acute bacterial bronchitis Rx Azithromycin.  Increase fluids.  Rest.  Saline nasal spray.  Probiotic.  Mucinex as directed.  Humidifier in bedroom. Rx Flonase.  Call or return to clinic if symptoms are not improving.  - azithromycin (ZITHROMAX) 250 MG tablet; Take 2 tablets on Day 1. Then take 1 tablet daily.  Dispense: 6 tablet; Refill: 0 - fluticasone  (FLONASE) 50 MCG/ACT nasal spray; Place 2 sprays into both nostrils daily.  Dispense: 16 g; Refill: 1   Piedad Climes, PA-C

## 2017-07-17 NOTE — Patient Instructions (Signed)
Take antibiotic (Azithromycin) as directed.  Increase fluids.  Get plenty of rest. Use Mucinex-DM for congestion. Use Flonase as directed. Take a daily probiotic (I recommend Align or Culturelle, but even Activia Yogurt may be beneficial).  A humidifier placed in the bedroom may offer some relief for a dry, scratchy throat of nasal irritation.  Read information below on acute bronchitis. Please call or return to clinic if symptoms are not improving.  Acute Bronchitis Bronchitis is when the airways that extend from the windpipe into the lungs get red, puffy, and painful (inflamed). Bronchitis often causes thick spit (mucus) to develop. This leads to a cough. A cough is the most common symptom of bronchitis. In acute bronchitis, the condition usually begins suddenly and goes away over time (usually in 2 weeks). Smoking, allergies, and asthma can make bronchitis worse. Repeated episodes of bronchitis may cause more lung problems.  HOME CARE  Rest.  Drink enough fluids to keep your pee (urine) clear or pale yellow (unless you need to limit fluids as told by your doctor).  Only take over-the-counter or prescription medicines as told by your doctor.  Avoid smoking and secondhand smoke. These can make bronchitis worse. If you are a smoker, think about using nicotine gum or skin patches. Quitting smoking will help your lungs heal faster.  Reduce the chance of getting bronchitis again by:  Washing your hands often.  Avoiding people with cold symptoms.  Trying not to touch your hands to your mouth, nose, or eyes.  Follow up with your doctor as told.  GET HELP IF: Your symptoms do not improve after 1 week of treatment. Symptoms include:  Cough.  Fever.  Coughing up thick spit.  Body aches.  Chest congestion.  Chills.  Shortness of breath.  Sore throat.  GET HELP RIGHT AWAY IF:   You have an increased fever.  You have chills.  You have severe shortness of breath.  You have  bloody thick spit (sputum).  You throw up (vomit) often.  You lose too much body fluid (dehydration).  You have a severe headache.  You faint.  MAKE SURE YOU:   Understand these instructions.  Will watch your condition.  Will get help right away if you are not doing well or get worse. Document Released: 07/17/2007 Document Revised: 09/30/2012 Document Reviewed: 07/21/2012 [facility] Patient Information 2015 [place], [place]. This information is not intended to replace advice given to you by your health care provider. Make sure you discuss any questions you have with your health care provider.

## 2017-09-22 ENCOUNTER — Ambulatory Visit (INDEPENDENT_AMBULATORY_CARE_PROVIDER_SITE_OTHER): Payer: 59 | Admitting: Physician Assistant

## 2017-09-22 ENCOUNTER — Other Ambulatory Visit: Payer: Self-pay

## 2017-09-22 ENCOUNTER — Encounter: Payer: Self-pay | Admitting: Physician Assistant

## 2017-09-22 VITALS — BP 112/76 | HR 75 | Temp 97.8°F | Resp 16 | Ht 68.0 in | Wt 156.8 lb

## 2017-09-22 DIAGNOSIS — N5089 Other specified disorders of the male genital organs: Secondary | ICD-10-CM

## 2017-09-22 DIAGNOSIS — N509 Disorder of male genital organs, unspecified: Secondary | ICD-10-CM | POA: Diagnosis not present

## 2017-09-22 NOTE — Progress Notes (Signed)
Patient presents to clinic today c/o mass of lateral R testicle first noted 2 weeks ago while taking a shower. Notes the area is firm but does move around. Is sensitive to touch but otherwise non-painful. Denies any drainage from area. Denies fever, chills, sweats, unexplained weight loss. Denies penile pain, discharge.  Past Medical History:  Diagnosis Date  . History of chickenpox   . History of kidney stones   . Kidney stones   . SVT (supraventricular tachycardia) (HCC)     Current Outpatient Medications on File Prior to Visit  Medication Sig Dispense Refill  . anastrozole (ARIMIDEX) 1 MG tablet TK 1 T PO QD  5  . Cholecalciferol (VITAMIN D) 2000 units tablet Take 1 tablet (2,000 Units total) by mouth daily. 30 tablet 0  . fluticasone (FLONASE) 50 MCG/ACT nasal spray Place 2 sprays into both nostrils daily. 16 g 1  . Omega-3 Fatty Acids (FISH OIL) 1200 MG CAPS Take 1 capsule (1,200 mg total) by mouth daily. 30 capsule 0   No current facility-administered medications on file prior to visit.     No Known Allergies  Family History  Problem Relation Age of Onset  . Healthy Mother   . Healthy Father   . Cancer Maternal Grandmother   . Hyperlipidemia Maternal Grandmother     Social History   Socioeconomic History  . Marital status: Married    Spouse name: Not on file  . Number of children: Not on file  . Years of education: Not on file  . Highest education level: Not on file  Occupational History  . Occupation: Nutritional therapistlumber  Social Needs  . Financial resource strain: Not on file  . Food insecurity:    Worry: Not on file    Inability: Not on file  . Transportation needs:    Medical: Not on file    Non-medical: Not on file  Tobacco Use  . Smoking status: Former Smoker    Years: 9.00    Types: Cigarettes  . Smokeless tobacco: Former NeurosurgeonUser    Types: Chew  Substance and Sexual Activity  . Alcohol use: Yes    Alcohol/week: 6.0 standard drinks    Types: 6 Cans of beer per  week    Comment: occas  . Drug use: No  . Sexual activity: Yes  Lifestyle  . Physical activity:    Days per week: Not on file    Minutes per session: Not on file  . Stress: Not on file  Relationships  . Social connections:    Talks on phone: Not on file    Gets together: Not on file    Attends religious service: Not on file    Active member of club or organization: Not on file    Attends meetings of clubs or organizations: Not on file    Relationship status: Not on file  Other Topics Concern  . Not on file  Social History Narrative  . Not on file   Review of Systems - See HPI.  All other ROS are negative.  BP 112/76   Pulse 75   Temp 97.8 F (36.6 C) (Oral)   Resp 16   Ht 5\' 8"  (1.727 m)   Wt 156 lb 12.8 oz (71.1 kg)   SpO2 98%   BMI 23.84 kg/m   Physical Exam  Constitutional: He is oriented to person, place, and time. He appears well-developed and well-nourished.  HENT:  Head: Normocephalic and atraumatic.  Eyes: Conjunctivae are normal.  Neck:  Neck supple.  Pulmonary/Chest: Effort normal.  Abdominal: Hernia confirmed negative in the right inguinal area and confirmed negative in the left inguinal area.  Genitourinary: Penis normal. Right testis shows mass (lateral mass confined to scrotum - if firm but not indurated. Is mobile. No testicular mass noted bilaterally.).  Lymphadenopathy: No inguinal adenopathy noted on the right or left side.  Neurological: He is alert and oriented to person, place, and time.  Vitals reviewed.  Assessment/Plan: 1. Scrotal mass No testicular mass on exam. Reassurance given. 1 cm Charlo mass of lateral R scrotum. Likely cyst versus blocked sebaceous/sudoriferous gland. Supportive measures discussed. If not improving, will set up with Dermatology or have him follow-up with his urologist for removal.     Piedad ClimesWilliam Cody Roxine Whittinghill, PA-C

## 2017-09-22 NOTE — Patient Instructions (Signed)
Thankfully the area of concern is a scrotal mass and not attached to the testicle.  Testicular examination is within normal limits which is great news!   The area of concern is most likely a cyst or blocked sweat/oil glands. Keep skin clean and dry. Apply warm compress to the area a couple of times per day to promote any drainage that may occur. Typically this will regress over a few weeks. If not noting improving within the next week, let me know and we can consider having a dermatologist drain/remove the area.

## 2017-10-04 ENCOUNTER — Other Ambulatory Visit: Payer: Self-pay | Admitting: Physician Assistant

## 2017-10-04 DIAGNOSIS — B9689 Other specified bacterial agents as the cause of diseases classified elsewhere: Secondary | ICD-10-CM

## 2017-10-04 DIAGNOSIS — J208 Acute bronchitis due to other specified organisms: Principal | ICD-10-CM

## 2017-10-30 ENCOUNTER — Ambulatory Visit: Payer: 59 | Admitting: Physician Assistant

## 2017-10-30 ENCOUNTER — Ambulatory Visit (INDEPENDENT_AMBULATORY_CARE_PROVIDER_SITE_OTHER): Payer: 59 | Admitting: Physician Assistant

## 2017-10-30 ENCOUNTER — Other Ambulatory Visit: Payer: Self-pay

## 2017-10-30 ENCOUNTER — Encounter: Payer: Self-pay | Admitting: Physician Assistant

## 2017-10-30 VITALS — BP 110/78 | HR 62 | Temp 97.8°F | Resp 14 | Ht 68.0 in | Wt 157.0 lb

## 2017-10-30 DIAGNOSIS — L729 Follicular cyst of the skin and subcutaneous tissue, unspecified: Secondary | ICD-10-CM

## 2017-10-30 NOTE — Progress Notes (Signed)
Patient presents to clinic today c/o enlargement of a scrotal cyst for which he was evaluated last month. Notes he has been trying to feel the area daily to see if it has been growing. Is sometimes tender to touch. Denies redness, warmth or drainage. Denies fever, chills. Denies new regions of concern.   Past Medical History:  Diagnosis Date  . History of chickenpox   . History of kidney stones   . Kidney stones   . SVT (supraventricular tachycardia) (HCC)     Current Outpatient Medications on File Prior to Visit  Medication Sig Dispense Refill  . anastrozole (ARIMIDEX) 1 MG tablet TK 1 T PO QD  5  . Cholecalciferol (VITAMIN D) 2000 units tablet Take 1 tablet (2,000 Units total) by mouth daily. 30 tablet 0  . fluticasone (FLONASE) 50 MCG/ACT nasal spray SHAKE LIQUID AND USE 2 SPRAYS IN EACH NOSTRIL DAILY 16 g 0  . Omega-3 Fatty Acids (FISH OIL) 1200 MG CAPS Take 1 capsule (1,200 mg total) by mouth daily. 30 capsule 0   No current facility-administered medications on file prior to visit.     No Known Allergies  Family History  Problem Relation Age of Onset  . Healthy Mother   . Healthy Father   . Cancer Maternal Grandmother   . Hyperlipidemia Maternal Grandmother     Social History   Socioeconomic History  . Marital status: Married    Spouse name: Not on file  . Number of children: Not on file  . Years of education: Not on file  . Highest education level: Not on file  Occupational History  . Occupation: Nutritional therapist  Social Needs  . Financial resource strain: Not on file  . Food insecurity:    Worry: Not on file    Inability: Not on file  . Transportation needs:    Medical: Not on file    Non-medical: Not on file  Tobacco Use  . Smoking status: Former Smoker    Years: 9.00    Types: Cigarettes  . Smokeless tobacco: Former Neurosurgeon    Types: Chew  Substance and Sexual Activity  . Alcohol use: Yes    Alcohol/week: 6.0 standard drinks    Types: 6 Cans of beer per week      Comment: occas  . Drug use: No  . Sexual activity: Yes  Lifestyle  . Physical activity:    Days per week: Not on file    Minutes per session: Not on file  . Stress: Not on file  Relationships  . Social connections:    Talks on phone: Not on file    Gets together: Not on file    Attends religious service: Not on file    Active member of club or organization: Not on file    Attends meetings of clubs or organizations: Not on file    Relationship status: Not on file  Other Topics Concern  . Not on file  Social History Narrative  . Not on file   Review of Systems - See HPI.  All other ROS are negative.  BP 110/78   Pulse 62   Temp 97.8 F (36.6 C) (Oral)   Resp 14   Ht 5\' 8"  (1.727 m)   Wt 157 lb (71.2 kg)   SpO2 98%   BMI 23.87 kg/m   Physical Exam  Constitutional: He appears well-developed and well-nourished.  HENT:  Head: Normocephalic and atraumatic.  Eyes: Conjunctivae are normal.  Cardiovascular: Normal rate, regular rhythm,  normal heart sounds and intact distal pulses.  Pulmonary/Chest: Effort normal.  Genitourinary:     Vitals reviewed.  Assessment/Plan: 1. Scrotal cyst Getting larger. No sign of infection today. Again recommended he avoid touching or squeezing the area. Will set him up with Dermatology for excision. Referral placed. Supportive measures again reviewed.  - Ambulatory referral to Dermatology    Piedad ClimesWilliam Cody Raphael Espe, PA-C

## 2017-10-30 NOTE — Patient Instructions (Signed)
Avoid touching the area! This only worsens size and tenderness. Can continue warm compresses. Try to keep skin clean and dry.  You will be contacted by Dermatology for removal of this area.

## 2017-11-10 ENCOUNTER — Other Ambulatory Visit: Payer: Self-pay | Admitting: Physician Assistant

## 2017-11-10 DIAGNOSIS — J208 Acute bronchitis due to other specified organisms: Principal | ICD-10-CM

## 2017-11-10 DIAGNOSIS — B9689 Other specified bacterial agents as the cause of diseases classified elsewhere: Secondary | ICD-10-CM

## 2017-12-08 ENCOUNTER — Other Ambulatory Visit: Payer: Self-pay | Admitting: Physician Assistant

## 2017-12-08 DIAGNOSIS — B9689 Other specified bacterial agents as the cause of diseases classified elsewhere: Secondary | ICD-10-CM

## 2017-12-08 DIAGNOSIS — J208 Acute bronchitis due to other specified organisms: Principal | ICD-10-CM

## 2017-12-11 ENCOUNTER — Encounter: Payer: Self-pay | Admitting: Physician Assistant

## 2017-12-18 ENCOUNTER — Ambulatory Visit (INDEPENDENT_AMBULATORY_CARE_PROVIDER_SITE_OTHER): Payer: 59 | Admitting: Physician Assistant

## 2017-12-18 ENCOUNTER — Encounter: Payer: Self-pay | Admitting: Physician Assistant

## 2017-12-18 ENCOUNTER — Other Ambulatory Visit: Payer: Self-pay

## 2017-12-18 VITALS — BP 100/60 | HR 68 | Temp 98.3°F | Resp 14 | Ht 68.0 in | Wt 162.0 lb

## 2017-12-18 DIAGNOSIS — M545 Low back pain: Secondary | ICD-10-CM | POA: Diagnosis not present

## 2017-12-18 DIAGNOSIS — G8929 Other chronic pain: Secondary | ICD-10-CM

## 2017-12-18 DIAGNOSIS — J208 Acute bronchitis due to other specified organisms: Secondary | ICD-10-CM | POA: Diagnosis not present

## 2017-12-18 MED ORDER — PROMETHAZINE-DM 6.25-15 MG/5ML PO SYRP: 5.0000 mL | ORAL_SOLUTION | Freq: Four times a day (QID) | ORAL | 0 refills | Status: DC | PRN

## 2017-12-18 MED ORDER — GABAPENTIN 100 MG PO CAPS: 100.0000 mg | ORAL_CAPSULE | Freq: Three times a day (TID) | ORAL | 3 refills | Status: DC

## 2017-12-18 NOTE — Progress Notes (Signed)
Patient presents to clinic today c/o 1 week of cough, mostly dry but sometimes productive of sputum. Denies sinus pressure, pain, ear pain or PND. Denies chest pain but notes some chest wall tenderness after coughing spells. Denies chest tightness or wheezing. Denies recent travel. Endorses sick contact at work with similar symptoms. Has taken multiple OTC with little relief of symptoms.  Patient also with known history of chronic low back pain secondary to OA with occasional nerve involvement and sciatica. Has noted significant pain recently in the absence of trauma/injury or change to regular activities. Has an appointment set up with Ortho for next month. Has been having adjustments with chiropractor which are not helping. He notes that he has taken some of his wife's gabapentin which seems to help significantly. Is wanting to know if this is an option for him while waiting on Ortho assessment.   Past Medical History:  Diagnosis Date  . History of chickenpox   . History of kidney stones   . Kidney stones   . SVT (supraventricular tachycardia) (HCC)     Current Outpatient Medications on File Prior to Visit  Medication Sig Dispense Refill  . anastrozole (ARIMIDEX) 1 MG tablet TK 1 T PO QD  5  . Cholecalciferol (VITAMIN D) 2000 units tablet Take 1 tablet (2,000 Units total) by mouth daily. 30 tablet 0  . fluticasone (FLONASE) 50 MCG/ACT nasal spray SHAKE LIQUID AND USE 2 SPRAYS IN EACH NOSTRIL DAILY 16 g 6  . Omega-3 Fatty Acids (FISH OIL) 1200 MG CAPS Take 1 capsule (1,200 mg total) by mouth daily. 30 capsule 0   No current facility-administered medications on file prior to visit.     No Known Allergies  Family History  Problem Relation Age of Onset  . Healthy Mother   . Healthy Father   . Cancer Maternal Grandmother   . Hyperlipidemia Maternal Grandmother     Social History   Socioeconomic History  . Marital status: Married    Spouse name: Not on file  . Number of children:  Not on file  . Years of education: Not on file  . Highest education level: Not on file  Occupational History  . Occupation: Nutritional therapist  Social Needs  . Financial resource strain: Not on file  . Food insecurity:    Worry: Not on file    Inability: Not on file  . Transportation needs:    Medical: Not on file    Non-medical: Not on file  Tobacco Use  . Smoking status: Former Smoker    Years: 9.00    Types: Cigarettes  . Smokeless tobacco: Former Neurosurgeon    Types: Chew  Substance and Sexual Activity  . Alcohol use: Yes    Alcohol/week: 6.0 standard drinks    Types: 6 Cans of beer per week    Comment: occas  . Drug use: No  . Sexual activity: Yes  Lifestyle  . Physical activity:    Days per week: Not on file    Minutes per session: Not on file  . Stress: Not on file  Relationships  . Social connections:    Talks on phone: Not on file    Gets together: Not on file    Attends religious service: Not on file    Active member of club or organization: Not on file    Attends meetings of clubs or organizations: Not on file    Relationship status: Not on file  Other Topics Concern  . Not on  file  Social History Narrative  . Not on file   Review of Systems - See HPI.  All other ROS are negative.  BP 100/60   Pulse 68   Temp 98.3 F (36.8 C) (Oral)   Resp 14   Ht 5\' 8"  (1.727 m)   Wt 162 lb (73.5 kg)   SpO2 98%   BMI 24.63 kg/m   Physical Exam  Constitutional: He is oriented to person, place, and time. He appears well-developed and well-nourished.  HENT:  Head: Normocephalic and atraumatic.  Right Ear: External ear normal.  Left Ear: External ear normal.  Nose: Nose normal.  Mouth/Throat: Oropharynx is clear and moist. No oropharyngeal exudate.  Eyes: Conjunctivae are normal.  Neck: Neck supple.  Cardiovascular: Normal rate, regular rhythm, normal heart sounds and intact distal pulses.  Pulmonary/Chest: Effort normal and breath sounds normal. No stridor. No respiratory  distress. He has no wheezes. He has no rales. He exhibits no tenderness.  Neurological: He is alert and oriented to person, place, and time.  Psychiatric: He has a normal mood and affect.  Vitals reviewed.  Assessment/Plan: 1. Viral bronchitis Start Promethazine-DM. Continue Flonase and begin saline nasal rinse. Other supportive measures and OTC medications reviewed with patient. Call or return if symptoms are not improving or if new symptoms develop.   2. Chronic bilateral low back pain without sciatica Will start trial of Gabapentin 100 mg TID. Follow-up 1 month if not already established with Orthopedics.    Piedad Climes, PA-C

## 2017-12-18 NOTE — Patient Instructions (Signed)
Please keep well-hydrated and get plenty of rest.  Continue flonase nasal spray and start a daily saline nasal rinse.  USe the prescription cough medication as directed to calm down these coughing spells. Put a humidifier in the bedroom. Symptoms should start to improve over the next 4-5 days. Cough can last a few weeks but should be much less severe and non-productive.  Start The Gabapentin taking 1 capsule twice daily x 3-4 days. If tolerating well, increase to 1 capsule three times daily. Follow-up with me in 4 weeks for reassessment.

## 2018-01-01 ENCOUNTER — Other Ambulatory Visit: Payer: Self-pay | Admitting: Physician Assistant

## 2018-01-02 ENCOUNTER — Other Ambulatory Visit: Payer: Self-pay | Admitting: Physician Assistant

## 2018-01-02 MED ORDER — PROMETHAZINE-DM 6.25-15 MG/5ML PO SYRP: 5.0000 mL | ORAL_SOLUTION | Freq: Four times a day (QID) | ORAL | 0 refills | Status: DC | PRN

## 2018-01-02 NOTE — Telephone Encounter (Signed)
Copied from CRM [id number]. Topic: Quick Communication - Rx Refill/Question >> Jan 02, 2018  3:26 PM Gerrianne ScalePayne, Lenyx Boody L wrote: Medication: promethazine-dextromethorphan (PROMETHAZINE-DM) 6.25-15 MG/5ML syrup  Has the patient contacted their pharmacy? Yes. today  (Agent: If no, request that the patient contact the pharmacy for the refill.) (Agent: If yes, when and what did the pharmacy advise?) told pt to call provider  Preferred Pharmacy (with phone number or street name): [id number] - [place], [place] - [address] [phone] (Phone) [phone] (Fax)    Agent: Please be advised that RX refills may take up to 3 business days. We ask that you follow-up with your pharmacy.

## 2018-02-25 ENCOUNTER — Other Ambulatory Visit: Payer: Self-pay | Admitting: Internal Medicine

## 2018-02-27 ENCOUNTER — Other Ambulatory Visit: Payer: Self-pay | Admitting: Internal Medicine

## 2018-02-27 DIAGNOSIS — E230 Hypopituitarism: Secondary | ICD-10-CM

## 2018-02-27 DIAGNOSIS — T1590XA Foreign body on external eye, part unspecified, unspecified eye, initial encounter: Secondary | ICD-10-CM

## 2018-03-09 ENCOUNTER — Ambulatory Visit
Admission: RE | Admit: 2018-03-09 | Discharge: 2018-03-09 | Disposition: A | Discharge status: Home / Self Care | Payer: 59 | Source: Ambulatory Visit | Attending: Internal Medicine | Admitting: Internal Medicine

## 2018-03-09 DIAGNOSIS — E230 Hypopituitarism: Secondary | ICD-10-CM

## 2018-03-09 DIAGNOSIS — T1590XA Foreign body on external eye, part unspecified, unspecified eye, initial encounter: Secondary | ICD-10-CM

## 2018-03-09 MED ORDER — GADOBENATE DIMEGLUMINE 529 MG/ML IV SOLN
10.0000 mL | Freq: Once | INTRAVENOUS | Status: AC | PRN
  Administered 2018-03-09: 10 mL via INTRAVENOUS

## 2018-03-30 ENCOUNTER — Encounter (HOSPITAL_COMMUNITY): Payer: Self-pay | Admitting: General Practice

## 2018-03-30 ENCOUNTER — Other Ambulatory Visit: Payer: Self-pay | Admitting: Urology

## 2018-04-02 ENCOUNTER — Encounter (HOSPITAL_COMMUNITY): Payer: Self-pay | Admitting: *Deleted

## 2018-04-02 ENCOUNTER — Encounter (HOSPITAL_COMMUNITY)
Admission: RE | Disposition: A | Discharge status: Home / Self Care | Payer: Self-pay | Source: Home / Self Care | Attending: Urology

## 2018-04-02 ENCOUNTER — Ambulatory Visit (HOSPITAL_COMMUNITY)
Admission: RE | Admit: 2018-04-02 | Discharge: 2018-04-02 | Disposition: A | Discharge status: Home / Self Care | Payer: 59 | Attending: Urology | Admitting: Urology

## 2018-04-02 ENCOUNTER — Ambulatory Visit (HOSPITAL_COMMUNITY): Payer: 59

## 2018-04-02 ENCOUNTER — Other Ambulatory Visit: Payer: Self-pay

## 2018-04-02 DIAGNOSIS — R310 Gross hematuria: Secondary | ICD-10-CM | POA: Insufficient documentation

## 2018-04-02 DIAGNOSIS — Z87891 Personal history of nicotine dependence: Secondary | ICD-10-CM | POA: Insufficient documentation

## 2018-04-02 DIAGNOSIS — Z87442 Personal history of urinary calculi: Secondary | ICD-10-CM | POA: Insufficient documentation

## 2018-04-02 DIAGNOSIS — N200 Calculus of kidney: Secondary | ICD-10-CM | POA: Insufficient documentation

## 2018-04-02 HISTORY — PX: EXTRACORPOREAL SHOCK WAVE LITHOTRIPSY: SHX1557

## 2018-04-02 HISTORY — DX: Dorsalgia, unspecified: M54.9

## 2018-04-02 SURGERY — LITHOTRIPSY, ESWL
Anesthesia: LOCAL | Laterality: Left

## 2018-04-02 MED ORDER — OXYCODONE-ACETAMINOPHEN 5-325 MG PO TABS: 1.0000 | ORAL_TABLET | Freq: Four times a day (QID) | ORAL | 0 refills | Status: DC | PRN

## 2018-04-02 MED ORDER — SODIUM CHLORIDE 0.9 % IV SOLN
INTRAVENOUS | Status: DC
  Administered 2018-04-02: 10:00:00 via INTRAVENOUS

## 2018-04-02 MED ORDER — DIAZEPAM 5 MG PO TABS
10.0000 mg | ORAL_TABLET | ORAL | Status: AC
  Administered 2018-04-02: 10 mg via ORAL
  Filled 2018-04-02: qty 2

## 2018-04-02 MED ORDER — DIPHENHYDRAMINE HCL 25 MG PO CAPS
25.0000 mg | ORAL_CAPSULE | ORAL | Status: AC
  Administered 2018-04-02: 25 mg via ORAL
  Filled 2018-04-02: qty 1

## 2018-04-02 MED ORDER — CIPROFLOXACIN HCL 500 MG PO TABS
500.0000 mg | ORAL_TABLET | ORAL | Status: AC
  Administered 2018-04-02: 500 mg via ORAL
  Filled 2018-04-02: qty 1

## 2018-04-02 NOTE — Discharge Instructions (Signed)
1 - You may have urinary urgency (bladder spasms), pass small stone fragments, and bloody urine on / off for up to 2 weeks. This is normal. ° °2 - Call MD or go to ER for fever >102, severe pain / nausea / vomiting not relieved by medications, or acute change in medical status ° °

## 2018-04-02 NOTE — Brief Op Note (Signed)
04/02/2018  12:29 PM  PATIENT:  Parker Carey  33 y.o. male  PRE-OPERATIVE DIAGNOSIS:  LEFT RENAL STONE  POST-OPERATIVE DIAGNOSIS:  * No post-op diagnosis entered *  PROCEDURE:  Procedure(s): EXTRACORPOREAL SHOCK WAVE LITHOTRIPSY (ESWL) (Left)  SURGEON:  Surgeon(s) and Role:    Alexis Frock, MD - Primary  PHYSICIAN ASSISTANT:   ASSISTANTS: none   ANESTHESIA:   MAC  EBL:  minimal   BLOOD ADMINISTERED:none  DRAINS: none   LOCAL MEDICATIONS USED:  NONE  SPECIMEN:  No Specimen  DISPOSITION OF SPECIMEN:  N/A  COUNTS:  YES  TOURNIQUET:  * No tourniquets in log *  DICTATION: .Note written in paper chart  PLAN OF CARE: Admit to inpatient   PATIENT DISPOSITION:  PACU - hemodynamically stable.   Delay start of Pharmacological VTE agent (>24hrs) due to surgical blood loss or risk of bleeding: yes

## 2018-04-02 NOTE — H&P (Signed)
Parker Carey is an 33 y.o. male.    Chief Complaint: Pre-Op LEFT Shockwave Lithotripsy  HPI:   1 - LEFT Renal Pelvis With Hematuria - 49m left renal pelvis stone with likely intermittent ball valving on eval gross hematuria. Stone is 100mm, solitary, SSD 9.5 cm and 970HU and at L2 area.  UCX negative.  Today " [name] " is seen to proceed with LEFT shockwave lithotripsy. NO interval fevers.   Past Medical History:  Diagnosis Date  . Back pain    L-5- S-1 back pain  . History of chickenpox   . History of kidney stones   . Kidney stones   . SVT (supraventricular tachycardia) (HCC)     Past Surgical History:  Procedure Laterality Date  . EXTRACORPOREAL SHOCK WAVE LITHOTRIPSY Left 07/04/2016   Procedure: LEFT EXTRACORPOREAL SHOCK WAVE LITHOTRIPSY (ESWL);  Surgeon: Parker Fat, MD;  Location: [facility];  Service: Urology;  Laterality: Left;  . FRACTURE SURGERY    . ORIF TIBIA & FIBULA FRACTURES Right 2005  . SUPRAVENTRICULAR TACHYCARDIA ABLATION  05/20/2014  . SUPRAVENTRICULAR TACHYCARDIA ABLATION N/A 05/20/2014   Procedure: SUPRAVENTRICULAR TACHYCARDIA ABLATION;  Surgeon: Parker Range, MD;  Location: [facility] CATH LAB;  Service: Cardiovascular;  Laterality: N/A;    Family History  Problem Relation Age of Onset  . Healthy Mother   . Healthy Father   . Cancer Maternal Grandmother   . Hyperlipidemia Maternal Grandmother    Social History:  reports that he has quit smoking. His smoking use included cigarettes. He quit after 9.00 years of use. He has quit using smokeless tobacco.  His smokeless tobacco use included chew. He reports current alcohol use of about 6.0 standard drinks of alcohol per week. He reports that he does not use drugs.  Allergies: No Known Allergies  No medications prior to admission.    No results found for this or any previous visit (from the past 48 hour(s)). No results found.  Review of Systems  Constitutional: Negative.  Negative for chills and fever.   HENT: Negative.   Respiratory: Negative.   Cardiovascular: Negative.   Gastrointestinal: Negative.   Genitourinary: Positive for hematuria.  Musculoskeletal: Negative.   Skin: Negative.   Neurological: Negative.   Endo/Heme/Allergies: Negative.   Psychiatric/Behavioral: Negative.     There were no vitals taken for this visit. Physical Exam  HENT:  Head: Normocephalic.  Eyes: Pupils are equal, round, and reactive to light.  Neck: Normal Carey of motion.  Cardiovascular: Normal rate.  Respiratory: Effort normal.  GI: Soft.  Genitourinary:    Genitourinary Comments: Minimal left CVAT at present.    Musculoskeletal: Normal Carey of motion.  Neurological: He is alert.  Skin: Skin is warm.  Psychiatric: He has a normal mood and affect.     Assessment/Plan  1 - LEFT Renal Pelvis With Hematuria -proceed with shockwave lithotripsy today. Risks, benefits, alternatives, expected peri-treatment course reiterated today.   Parker Ache, MD 04/02/2018, 7:50 AM

## 2018-04-03 ENCOUNTER — Encounter (HOSPITAL_COMMUNITY): Payer: Self-pay | Admitting: Urology

## 2018-04-11 ENCOUNTER — Other Ambulatory Visit: Payer: Self-pay | Admitting: Physician Assistant

## 2018-04-15 ENCOUNTER — Ambulatory Visit (INDEPENDENT_AMBULATORY_CARE_PROVIDER_SITE_OTHER): Payer: Self-pay | Admitting: Family Medicine

## 2018-04-17 ENCOUNTER — Other Ambulatory Visit: Payer: Self-pay

## 2018-04-17 ENCOUNTER — Ambulatory Visit (INDEPENDENT_AMBULATORY_CARE_PROVIDER_SITE_OTHER): Payer: 59 | Admitting: Physician Assistant

## 2018-04-17 ENCOUNTER — Telehealth: Payer: Self-pay | Admitting: Physician Assistant

## 2018-04-17 ENCOUNTER — Encounter: Payer: Self-pay | Admitting: Physician Assistant

## 2018-04-17 VITALS — BP 112/80 | HR 87 | Temp 98.4°F | Resp 14 | Ht 67.0 in | Wt 168.0 lb

## 2018-04-17 DIAGNOSIS — J069 Acute upper respiratory infection, unspecified: Secondary | ICD-10-CM | POA: Diagnosis not present

## 2018-04-17 NOTE — Progress Notes (Signed)
Patient presents to clinic today c/o 1.5 days of dry cough, scratchy throat and fatigue. Noted feeling feverish yesterday but no objective fever taken. Not feeling febrile today. Denies ear pain, sinus pain, tooth pain. Denies sick contact. Feeling somewhat better today.  Past Medical History:  Diagnosis Date  . Back pain    L-5- S-1 back pain  . History of chickenpox   . History of kidney stones   . Kidney stones   . SVT (supraventricular tachycardia) (HCC)     Current Outpatient Medications on File Prior to Visit  Medication Sig Dispense Refill  . B-D 3CC LUER-LOK SYR 21GX1-1/2 21G X 1-1/2" 3 ML MISC U TO ADMINISTER TESTOSTERONE INJECTION Q 2 WEEKS    . Cholecalciferol (VITAMIN D) 2000 units tablet Take 1 tablet (2,000 Units total) by mouth daily. 30 tablet 0  . fluticasone (FLONASE) 50 MCG/ACT nasal spray SHAKE LIQUID AND USE 2 SPRAYS IN EACH NOSTRIL DAILY 16 g 6  . gabapentin (NEURONTIN) 100 MG capsule TAKE 1 CAPSULE(100 MG) BY MOUTH THREE TIMES DAILY 90 capsule 0  . Omega-3 Fatty Acids (FISH OIL) 1200 MG CAPS Take 1 capsule (1,200 mg total) by mouth daily. 30 capsule 0  . testosterone cypionate (DEPOTESTOSTERONE CYPIONATE) 200 MG/ML injection INJECT INTO THE MUSCLE EVERY 2 WEEKS     No current facility-administered medications on file prior to visit.     No Known Allergies  Family History  Problem Relation Age of Onset  . Healthy Mother   . Healthy Father   . Cancer Maternal Grandmother   . Hyperlipidemia Maternal Grandmother     Social History   Socioeconomic History  . Marital status: Married    Spouse name: Not on file  . Number of children: Not on file  . Years of education: Not on file  . Highest education level: Not on file  Occupational History  . Occupation: Nutritional therapist  Social Needs  . Financial resource strain: Not on file  . Food insecurity:    Worry: Not on file    Inability: Not on file  . Transportation needs:    Medical: Not on file   Non-medical: Not on file  Tobacco Use  . Smoking status: Former Smoker    Years: 9.00    Types: Cigarettes  . Smokeless tobacco: Former Neurosurgeon    Types: Chew  Substance and Sexual Activity  . Alcohol use: Yes    Alcohol/week: 6.0 standard drinks    Types: 6 Cans of beer per week    Comment: occas  . Drug use: No  . Sexual activity: Yes  Lifestyle  . Physical activity:    Days per week: Not on file    Minutes per session: Not on file  . Stress: Not on file  Relationships  . Social connections:    Talks on phone: Not on file    Gets together: Not on file    Attends religious service: Not on file    Active member of club or organization: Not on file    Attends meetings of clubs or organizations: Not on file    Relationship status: Not on file  Other Topics Concern  . Not on file  Social History Narrative  . Not on file   Review of Systems - See HPI.  All other ROS are negative.  BP 112/80   Pulse 87   Temp 98.4 F (36.9 C) (Oral)   Resp 14   Ht 5\' 7"  (1.702 m)  Wt 168 lb (76.2 kg)   SpO2 98%   BMI 26.31 kg/m   Physical Exam Vitals signs reviewed.  Constitutional:      Appearance: He is well-developed.  HENT:     Head: Normocephalic and atraumatic.     Right Ear: Tympanic membrane normal.     Left Ear: Tympanic membrane normal.     Nose: Rhinorrhea present. No congestion.     Mouth/Throat:     Mouth: Mucous membranes are moist. No oral lesions.     Pharynx: Oropharynx is clear. No pharyngeal swelling, oropharyngeal exudate, posterior oropharyngeal erythema or uvula swelling.     Tonsils: No tonsillar exudate.  Eyes:     Conjunctiva/sclera: Conjunctivae normal.  Neck:     Musculoskeletal: Normal range of motion and neck supple.  Cardiovascular:     Rate and Rhythm: Normal rate and regular rhythm.     Heart sounds: Normal heart sounds.  Pulmonary:     Effort: Pulmonary effort is normal.     Breath sounds: Normal breath sounds.  Neurological:     Mental  Status: He is alert.    Assessment/Plan: 1. Viral URI Exam unremarkable. Increase fluids and get plenty of rest. Supportive measures and OTC medications reviewed.   Piedad Climes, PA-C

## 2018-04-17 NOTE — Telephone Encounter (Signed)
Spoke with patient, per PCP instructions, monitor symptoms that he was seen for and diagnosed today over the weekend. If symptoms worsen, please go to urgent care or call and schedule appt 04/20/18.

## 2018-04-17 NOTE — Telephone Encounter (Signed)
Copied from CRM [id number]. Topic: Quick Communication - See Telephone Encounter >> Apr 17, 2018  3:57 PM Terisa Starr wrote: CRM for notification. See Telephone encounter for: 04/17/18.  Patient just called and said that he was in the office today. 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 He would like to know does he have anything to be worried about.

## 2018-04-17 NOTE — Patient Instructions (Signed)
Please keep well-hydrated and get plenty of rest.  Start Delsym over-the-counter for cough. Start a saline nasal rinse. If you have a humidifier, place in the bedroom.   Symptoms should ease up the next few days! If not, let us know!   Viral Respiratory Infection A viral respiratory infection is an illness that affects parts of the body that are used for breathing. These include the lungs, nose, and throat. It is caused by a germ called a virus. Some examples of this kind of infection are:  A cold.  The flu (influenza).  A respiratory syncytial virus (RSV) infection. A person who gets this illness may have the following symptoms:  A stuffy or runny nose.  Yellow or green fluid in the nose.  A cough.  Sneezing.  Tiredness (fatigue).  Achy muscles.  A sore throat.  Sweating or chills.  A fever.  A headache. Follow these instructions at home: Managing pain and congestion  Take over-the-counter and prescription medicines only as told by your doctor.  If you have a sore throat, gargle with salt water. Do this 3-4 times per day or as needed. To make a salt-water mixture, dissolve -1 tsp of salt in 1 cup of warm water. Make sure that all the salt dissolves.  Use nose drops made from salt water. This helps with stuffiness (congestion). It also helps soften the skin around your nose.  Drink enough fluid to keep your pee (urine) pale yellow. General instructions   Rest as much as possible.  Do not drink alcohol.  Do not use any products that have nicotine or tobacco, such as cigarettes and e-cigarettes. If you need help quitting, ask your doctor.  Keep all follow-up visits as told by your doctor. This is important. How is this prevented?   Get a flu shot every year. Ask your doctor when you should get your flu shot.  Do not let other people get your germs. If you are sick: ? Stay home from work or school. ? Wash your hands with soap and water often. Wash your  hands after you cough or sneeze. If soap and water are not available, use hand sanitizer.  Avoid contact with people who are sick during cold and flu season. This is in fall and winter. Get help if:  Your symptoms last for 10 days or longer.  Your symptoms get worse over time.  You have a fever.  You have very bad pain in your face or forehead.  Parts of your jaw or neck become very swollen. Get help right away if:  You feel pain or pressure in your chest.  You have shortness of breath.  You faint or feel like you will faint.  You keep throwing up (vomiting).  You feel confused. Summary  A viral respiratory infection is an illness that affects parts of the body that are used for breathing.  Examples of this illness include a cold, the flu, and respiratory syncytial virus (RSV) infection.  The infection can cause a runny nose, cough, sneezing, sore throat, and fever.  Follow what your doctor tells you about taking medicines, drinking lots of fluid, washing your hands, resting at home, and avoiding people who are sick. This information is not intended to replace advice given to you by your health care provider. Make sure you discuss any questions you have with your health care provider. Document Released: 01/11/2008 Document Revised: 03/10/2017 Document Reviewed: 03/10/2017 Elsevier Interactive Patient Education  2019 [employer].

## 2018-04-17 NOTE — Telephone Encounter (Signed)
Patient was seen today in office for 24 hours of very mild URI symptoms that are improving since onset. Has had mild rhinorrhea and scratchy throat with fatigue. No fever, chest congestion or SOB.   I hate to hear this news about a job site he had been at but I feel he is fine as symptoms today were not indicative of severe infection, including Coronavirus. That being said needs to stay home until feeling completely better. Notify us if any fever, chest congestion or SOB as he would need repeat assessment and testing.

## 2018-04-21 ENCOUNTER — Ambulatory Visit (INDEPENDENT_AMBULATORY_CARE_PROVIDER_SITE_OTHER): Payer: 59

## 2018-04-21 ENCOUNTER — Encounter (INDEPENDENT_AMBULATORY_CARE_PROVIDER_SITE_OTHER): Payer: Self-pay | Admitting: Family Medicine

## 2018-04-21 ENCOUNTER — Ambulatory Visit (INDEPENDENT_AMBULATORY_CARE_PROVIDER_SITE_OTHER): Payer: 59 | Admitting: Family Medicine

## 2018-04-21 DIAGNOSIS — M5441 Lumbago with sciatica, right side: Secondary | ICD-10-CM

## 2018-04-21 DIAGNOSIS — G8929 Other chronic pain: Secondary | ICD-10-CM

## 2018-04-21 MED ORDER — TIZANIDINE HCL 2 MG PO TABS: 2.0000 mg | ORAL_TABLET | Freq: Every evening | ORAL | 1 refills | Status: DC | PRN

## 2018-04-21 MED ORDER — METHYLPREDNISOLONE 4 MG PO TBPK: ORAL_TABLET | ORAL | 0 refills | Status: DC

## 2018-04-21 NOTE — Progress Notes (Signed)
Office Visit Note   Patient: Parker Carey           Date of Birth: 03-13-1985           MRN: [id number] Visit Date: 04/21/2018 Requested by: Waldon Merl, PA-C [id number] [place], [place] [address] PCP: Noel Journey  Subjective: Chief Complaint  Patient presents with  . Lower Back - Pain    Temporary relief with chiropractic care. "Horrible pain" for the past 2 weeks. Chronic pain x years. Radiates down back of right leg to knee. No numbness/tingling.    HPI: He is a 33 year old with right low back and posterior hip pain.  Symptoms started about 5 years ago.  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  His job is very strenuous, lots of lifting, climbing, bending etc.  Over the years he has gotten temporary relief from chiropractic treatments but lately it does not seem to be working for him.  Pain is always on the right side and has become very severe.  He has some Percocet from a kidney stone, and has been using it for his pain.  Denies any bowel or bladder dysfunction, denies any numbness or weakness in his legs.  No fevers or chills.              ROS: Otherwise noncontributory  Objective: Vital Signs: There were no vitals taken for this visit.  Physical Exam:  Low back: No scoliosis, leg lengths are equal.  Still has good flexibility, no tenderness over the SI joints or lumbar spinous processes.  Moderate tenderness in the right sciatic notch but negative straight leg raise.  Lower extremity strength and reflexes are normal.  Imaging: X-rays lumbar spine: Moderate L5-S1 degenerative disc disease.  No spondylolisthesis, no sign of fractures.  Alignment is otherwise anatomic.  Assessment & Plan: 1.  Chronic low back and right posterior hip pain, question disc protrusion with foraminal stenosis. -MRI to evaluate, possibly epidural steroid injections depending on the results.  Medrol Dosepak for now, Zanaflex as needed.     Procedures: No  procedures performed  No notes on file     PMFS History: Patient Active Problem List   Diagnosis Date Noted  . Chronic fatigue 04/02/2017  . Visit for preventive health examination 04/02/2017  . SVT (supraventricular tachycardia) (HCC) 05/20/2014   Past Medical History:  Diagnosis Date  . Back pain    L-5- S-1 back pain  . History of chickenpox   . History of kidney stones   . Kidney stones   . SVT (supraventricular tachycardia) (HCC)     Family History  Problem Relation Age of Onset  . Healthy Mother   . Healthy Father   . Cancer Maternal Grandmother   . Hyperlipidemia Maternal Grandmother     Past Surgical History:  Procedure Laterality Date  . EXTRACORPOREAL SHOCK WAVE LITHOTRIPSY Left 07/04/2016   Procedure: LEFT EXTRACORPOREAL SHOCK WAVE LITHOTRIPSY (ESWL);  Surgeon: Crist Fat, MD;  Location: [facility];  Service: Urology;  Laterality: Left;  . EXTRACORPOREAL SHOCK WAVE LITHOTRIPSY Left 04/02/2018   Procedure: EXTRACORPOREAL SHOCK WAVE LITHOTRIPSY (ESWL);  Surgeon: Sebastian Ache, MD;  Location: [facility];  Service: Urology;  Laterality: Left;  . FRACTURE SURGERY    . ORIF TIBIA & FIBULA FRACTURES Right 2005  . SUPRAVENTRICULAR TACHYCARDIA ABLATION  05/20/2014  . SUPRAVENTRICULAR TACHYCARDIA ABLATION N/A 05/20/2014   Procedure: SUPRAVENTRICULAR TACHYCARDIA ABLATION;  Surgeon: Hillis Range, MD;  Location:  [facility];  Service: Cardiovascular;  Laterality: N/A;   Social History   Occupational History  . Occupation: Plumber  Tobacco Use  . Smoking status: Former Smoker    Years: 9.00    Types: Cigarettes  . Smokeless tobacco: Former Neurosurgeon    Types: Chew  Substance and Sexual Activity  . Alcohol use: Yes    Alcohol/week: 6.0 standard drinks    Types: 6 Cans of beer per week    Comment: occas  . Drug use: No  . Sexual activity: Yes

## 2018-04-27 ENCOUNTER — Telehealth (INDEPENDENT_AMBULATORY_CARE_PROVIDER_SITE_OTHER): Payer: Self-pay | Admitting: *Deleted

## 2018-04-27 NOTE — Telephone Encounter (Signed)
I called and sw pt wife Efraim Kaufmann and informed her that [facility] has the order for MRI and should be giving him a call to schedule and advised if like he or her self can call the imaging to schedule to get it expedited. Melissa will let pt know and give imaging a call.

## 2018-04-27 NOTE — Telephone Encounter (Signed)
-----   Message from Lavada Mesi, MD sent at 04/27/2018  2:14 PM EDT ----- Regarding: MRI Pt requests update on status of MRI order.

## 2018-05-14 ENCOUNTER — Other Ambulatory Visit: Payer: 59

## 2018-06-26 ENCOUNTER — Other Ambulatory Visit (INDEPENDENT_AMBULATORY_CARE_PROVIDER_SITE_OTHER): Payer: Self-pay | Admitting: Family Medicine

## 2018-06-30 ENCOUNTER — Ambulatory Visit
Admission: RE | Admit: 2018-06-30 | Discharge: 2018-06-30 | Disposition: A | Discharge status: Home / Self Care | Payer: 59 | Source: Ambulatory Visit | Attending: Family Medicine | Admitting: Family Medicine

## 2018-06-30 ENCOUNTER — Telehealth: Payer: Self-pay | Admitting: Family Medicine

## 2018-06-30 ENCOUNTER — Other Ambulatory Visit: Payer: Self-pay

## 2018-06-30 DIAGNOSIS — G8929 Other chronic pain: Secondary | ICD-10-CM

## 2018-06-30 NOTE — Telephone Encounter (Signed)
Lumbar MRI scan shows the following:  There is mild degenerative disc disease at L4-5 but no disc protrusion or nerve impingement.  There is a small disc protrusion at L5-S1 but it does not appear to be causing any nerve compression.  No indication for surgery based on MRI findings.  However, for pain relief we could contemplate referral for epidural steroid injection.

## 2018-07-01 ENCOUNTER — Other Ambulatory Visit: Payer: Self-pay | Admitting: Family Medicine

## 2018-07-01 MED ORDER — TIZANIDINE HCL 2 MG PO TABS: 2.0000 mg | ORAL_TABLET | Freq: Four times a day (QID) | ORAL | 1 refills | Status: DC | PRN

## 2018-07-01 NOTE — Telephone Encounter (Signed)
I left a message on patient's mobile voice mail to check MyChart. If any questions/concerns, he can respond to the MRI results message through MyChart or call us.

## 2018-07-22 ENCOUNTER — Encounter (INDEPENDENT_AMBULATORY_CARE_PROVIDER_SITE_OTHER): Payer: Self-pay | Admitting: Family Medicine

## 2018-07-22 ENCOUNTER — Other Ambulatory Visit: Payer: Self-pay | Admitting: Family Medicine

## 2018-07-22 DIAGNOSIS — G8929 Other chronic pain: Secondary | ICD-10-CM

## 2018-08-10 ENCOUNTER — Ambulatory Visit (INDEPENDENT_AMBULATORY_CARE_PROVIDER_SITE_OTHER): Payer: 59 | Admitting: Physical Medicine and Rehabilitation

## 2018-08-10 ENCOUNTER — Other Ambulatory Visit: Payer: Self-pay

## 2018-08-10 ENCOUNTER — Ambulatory Visit: Payer: Self-pay

## 2018-08-10 ENCOUNTER — Encounter: Payer: Self-pay | Admitting: Physical Medicine and Rehabilitation

## 2018-08-10 VITALS — BP 131/83

## 2018-08-10 DIAGNOSIS — M5416 Radiculopathy, lumbar region: Secondary | ICD-10-CM | POA: Diagnosis not present

## 2018-08-10 MED ORDER — BETAMETHASONE SOD PHOS & ACET 6 (3-3) MG/ML IJ SUSP
12.0000 mg | Freq: Once | INTRAMUSCULAR | Status: AC
  Administered 2018-08-10: 12 mg

## 2018-08-10 NOTE — Progress Notes (Signed)
.  Numeric Pain Rating Scale and Functional Assessment Average Pain 4   In the last MONTH (on 0-10 scale) has pain interfered with the following?  1. General activity like being  able to carry out your everyday physical activities such as walking, climbing stairs, carrying groceries, or moving a chair?  Rating(3)   +Driver, -BT, -Dye Allergies.  

## 2018-08-11 NOTE — Procedures (Signed)
S1 Lumbosacral Transforaminal Epidural Steroid Injection - Sub-Pedicular Approach with Fluoroscopic Guidance   Patient: Parker Carey      Date of Birth: 18-Dec-1985 MRN: [id number] PCP: Brunetta Jeans, PA-C      Visit Date: 08/10/2018   Universal Protocol:    Date/Time: 06/30/206:24 AM  Consent Given By: the patient  Position:  PRONE  Additional Comments: Vital signs were monitored before and after the procedure. Patient was prepped and draped in the usual sterile fashion. The correct patient, procedure, and site was verified.   Injection Procedure Details:  Procedure Site One Meds Administered:  Meds ordered this encounter  Medications  . betamethasone acetate-betamethasone sodium phosphate (CELESTONE) injection 12 mg    Laterality: Right  Location/Site:  S1 Foramen   Needle size: 22 ga.  Needle type: Spinal  Needle Placement: Transforaminal  Findings:   -Comments: Excellent flow of contrast along the nerve and into the epidural space.  Procedure Details: After squaring off the sacral end-plate to get a true AP view, the C-arm was positioned so that the best possible view of the S1 foramen was visualized. The soft tissues overlying this structure were infiltrated with 2-3 ml. of 1% Lidocaine without Epinephrine.    The spinal needle was inserted toward the target using a "trajectory" view along the fluoroscope beam.  Under AP and lateral visualization, the needle was advanced so it did not puncture dura. Biplanar projections were used to confirm position. Aspiration was confirmed to be negative for CSF and/or blood. A 1-2 ml. volume of Isovue-250 was injected and flow of contrast was noted at each level. Radiographs were obtained for documentation purposes.   After attaining the desired flow of contrast documented above, a 0.5 to 1.0 ml test dose of 0.25% Marcaine was injected into each respective transforaminal space.  The patient was observed for 90 seconds  post injection.  After no sensory deficits were reported, and normal lower extremity motor function was noted,   the above injectate was administered so that equal amounts of the injectate were placed at each foramen (level) into the transforaminal epidural space.   Additional Comments:  The patient tolerated the procedure well Dressing: Band-Aid with 2 x 2 sterile gauze    Post-procedure details: Patient was observed during the procedure. Post-procedure instructions were reviewed.  Patient left the clinic in stable condition.

## 2018-08-11 NOTE — Progress Notes (Signed)
Parker Carey - 33 y.o. male MRN [id number]  Date of birth: 25-May-1985  Office Visit Note: Visit Date: 08/10/2018 PCP: Brunetta Jeans, PA-C Referred by: Brunetta Jeans, PA-C  Subjective: Chief Complaint  Patient presents with   Lower Back - Pain   HPI:  Parker Carey is a 33 y.o. male who comes in today At the request of Dr. Legrand Como hilts for diagnostic and hopefully therapeutic right S1 transforaminal epidural injection.  Patient's been having chronic low back pain for many years now more times it will go into the right buttock and posterior leg and more of a classic S1 distribution.  Pain can be worse with prolonged sitting which seems to be could be related to disc related symptoms.  He does report taking some what he calls painkillers from prior history of kidney stones when the pain is bad.  MRI findings are fairly mild with shallow central disc protrusion at L5-S1.  This obviously could be a radiculitis.  He is not had resolution with his normal chiropractic care and conservative care.  Depending on relief of symptoms with diagnostic transforaminal injection would suggest further work on piriformis type issues and possible sciatic nerve irritation.  Back pain seems to be more mechanical to and obviously core strengthening and continued conservative care would be the norm.  ROS Otherwise per HPI.  Assessment & Plan: Visit Diagnoses:  1. Lumbar radiculopathy     Plan: No additional findings.   Meds & Orders:  Meds ordered this encounter  Medications   betamethasone acetate-betamethasone sodium phosphate (CELESTONE) injection 12 mg    Orders Placed This Encounter  Procedures   XR C-ARM NO REPORT   Epidural Steroid injection    Follow-up: No follow-ups on file.   Procedures: No procedures performed  S1 Lumbosacral Transforaminal Epidural Steroid Injection - Sub-Pedicular Approach with Fluoroscopic Guidance   Patient: Parker Carey      Date of Birth:  Apr 26, 1985 MRN: [id number] PCP: Brunetta Jeans, PA-C      Visit Date: 08/10/2018   Universal Protocol:    Date/Time: 06/30/206:24 AM  Consent Given By: the patient  Position:  PRONE  Additional Comments: Vital signs were monitored before and after the procedure. Patient was prepped and draped in the usual sterile fashion. The correct patient, procedure, and site was verified.   Injection Procedure Details:  Procedure Site One Meds Administered:  Meds ordered this encounter  Medications   betamethasone acetate-betamethasone sodium phosphate (CELESTONE) injection 12 mg    Laterality: Right  Location/Site:  S1 Foramen   Needle size: 22 ga.  Needle type: Spinal  Needle Placement: Transforaminal  Findings:   -Comments: Excellent flow of contrast along the nerve and into the epidural space.  Procedure Details: After squaring off the sacral end-plate to get a true AP view, the C-arm was positioned so that the best possible view of the S1 foramen was visualized. The soft tissues overlying this structure were infiltrated with 2-3 ml. of 1% Lidocaine without Epinephrine.    The spinal needle was inserted toward the target using a "trajectory" view along the fluoroscope beam.  Under AP and lateral visualization, the needle was advanced so it did not puncture dura. Biplanar projections were used to confirm position. Aspiration was confirmed to be negative for CSF and/or blood. A 1-2 ml. volume of Isovue-250 was injected and flow of contrast was noted at each level. Radiographs were obtained for documentation purposes.   After attaining the desired flow  of contrast documented above, a 0.5 to 1.0 ml test dose of 0.25% Marcaine was injected into each respective transforaminal space.  The patient was observed for 90 seconds post injection.  After no sensory deficits were reported, and normal lower extremity motor function was noted,   the above injectate was administered so that  equal amounts of the injectate were placed at each foramen (level) into the transforaminal epidural space.   Additional Comments:  The patient tolerated the procedure well Dressing: Band-Aid with 2 x 2 sterile gauze    Post-procedure details: Patient was observed during the procedure. Post-procedure instructions were reviewed.  Patient left the clinic in stable condition.    Clinical History: MRI LUMBAR SPINE WITHOUT CONTRAST  TECHNIQUE: Multiplanar, multisequence MR imaging of the lumbar spine was performed. No intravenous contrast was administered.  COMPARISON:  Lumbar radiographs 04/21/2018  FINDINGS: Segmentation:  Normal  Alignment:  Normal  Vertebrae:  Normal bone marrow.  Negative for fracture or mass.  Conus medullaris and cauda equina: Conus extends to the L1 level. Conus and cauda equina appear normal.  Paraspinal and other soft tissues: Negative for paraspinous mass or edema  Disc levels:  L1-2: Negative  L2-3: Negative  L3-4: Negative  L4-5: Mild disc and mild facet degeneration. Negative for disc protrusion or stenosis  L5-S1: Shallow central disc protrusion. Negative for stenosis or neural impingement.  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 Negative for neural impingement or spinal stenosis.   Electronically Signed   By: Marlan Palauharles  Clark M.D.   On: 06/30/2018 12:50     Objective:  VS:  HT:     WT:    BMI:      BP:131/83   HR: bpm   TEMP: ( )   RESP:  Physical Exam  Ortho Exam Imaging: Xr C-arm No Report  Result Date: 08/10/2018 Please see Notes tab for imaging impression.

## 2018-08-13 ENCOUNTER — Ambulatory Visit (INDEPENDENT_AMBULATORY_CARE_PROVIDER_SITE_OTHER): Payer: 59 | Admitting: Family Medicine

## 2018-08-13 ENCOUNTER — Other Ambulatory Visit: Payer: Self-pay

## 2018-08-13 ENCOUNTER — Encounter: Payer: Self-pay | Admitting: Family Medicine

## 2018-08-13 ENCOUNTER — Ambulatory Visit: Payer: Self-pay

## 2018-08-13 DIAGNOSIS — M79661 Pain in right lower leg: Secondary | ICD-10-CM | POA: Diagnosis not present

## 2018-08-13 MED ORDER — OXYCODONE-ACETAMINOPHEN 5-325 MG PO TABS: 1.0000 | ORAL_TABLET | Freq: Four times a day (QID) | ORAL | 0 refills | Status: DC | PRN

## 2018-08-13 MED ORDER — NABUMETONE 750 MG PO TABS: 750.0000 mg | ORAL_TABLET | Freq: Two times a day (BID) | ORAL | 6 refills | Status: DC | PRN

## 2018-08-13 NOTE — Progress Notes (Signed)
Office Visit Note   Patient: Parker Carey           Date of Birth: [birth year]           MRN: [id number] Visit Date: 08/13/2018 Requested by: Brunetta Jeans, PA-C [id number] [place],  [place] [id number] PCP: Delorse Limber  Subjective: Chief Complaint  Patient presents with  . Right Lower Leg - Pain    S/p surgery on lower leg for compound fracture 2005. Started hurting 2 weeks ago. Some days are worse than others. Limps on worst days.    HPI: He is here with right lower leg pain.  Symptoms started about 2 weeks ago with no injury.  No changes in routine to account for the pain.  He started noticing pain when walking, when pushing off with his foot.  Pain is mostly on the medial aspect of his lower leg.  He is status post compound tibia fracture ORIF in 2005.  He was doing well, no symptoms until recently.  Incidentally he had an epidural injection June 29 and did well with that.              ROS: No fever or chills, no bowel or bladder dysfunction.  All other systems were reviewed and are negative.  Objective: Vital Signs: There were no vitals taken for this visit.  Physical Exam:  General:  Alert and oriented, in no acute distress. Pulm:  Breathing unlabored. Psy:  Normal mood, congruent affect. Skin: No skin breakdown, no erythema or bruising, no rash. Right leg: Old well-healed surgical scars distal medial tibia area.  Just above his scar he has some bony tenderness and also muscular tenderness near the soleus/tibialis posterior.  There is some pain with plantarflexion of the ankle against resistance and a little bit of pain with supination of the foot, no pain with flexion of the great toe.  Normal strength.  Imaging: X-rays right tibia/fibula: Surgical hardware is intact, no sign of loosening.  No stress fracture seen.  Old fractures are well-healed.    Assessment & Plan: 1.  Right lower leg pain, etiology uncertain.  Could be a muscular/myofascial  trigger point, but another possibility would be lumbar radiculopathy. -We will try different medications, home stretching exercises.  If symptoms persist could try physical therapy myofascial release techniques.     Procedures: No procedures performed  No notes on file     PMFS History: Patient Active Problem List   Diagnosis Date Noted  . Chronic fatigue 04/02/2017  . Visit for preventive health examination 04/02/2017  . SVT (supraventricular tachycardia) ([facility]) 05/20/2014   Past Medical History:  Diagnosis Date  . Back pain    L-5- S-1 back pain  . History of chickenpox   . History of kidney stones   . Kidney stones   . SVT (supraventricular tachycardia) ([facility])     Family History  Problem Relation Age of Onset  . Healthy Mother   . Healthy Father   . Cancer Maternal Grandmother   . Hyperlipidemia Maternal Grandmother     Past Surgical History:  Procedure Laterality Date  . EXTRACORPOREAL SHOCK WAVE LITHOTRIPSY Left 07/04/2016   Procedure: LEFT EXTRACORPOREAL SHOCK WAVE LITHOTRIPSY (ESWL);  Surgeon: Ardis Hughs, MD;  Location: [facility];  Service: Urology;  Laterality: Left;  . EXTRACORPOREAL SHOCK WAVE LITHOTRIPSY Left 04/02/2018   Procedure: EXTRACORPOREAL SHOCK WAVE LITHOTRIPSY (ESWL);  Surgeon: Alexis Frock, MD;  Location: [facility];  Service: Urology;  Laterality: Left;  . FRACTURE SURGERY    . ORIF TIBIA & FIBULA FRACTURES Right 2005  . SUPRAVENTRICULAR TACHYCARDIA ABLATION  05/20/2014  . SUPRAVENTRICULAR TACHYCARDIA ABLATION N/A 05/20/2014   Procedure: SUPRAVENTRICULAR TACHYCARDIA ABLATION;  Surgeon: Hillis RangeJames Allred, MD;  [facility];  Service: Cardiovascular;  Laterality: N/A;   Social History   Occupational History  . Occupation: Plumber  Tobacco Use  . Smoking status: Former Smoker    Years: 9.00    Types: Cigarettes  . Smokeless tobacco: Former NeurosurgeonUser    Types: Chew  Substance and Sexual Activity  . Alcohol use: Yes    Alcohol/week: 6.0  standard drinks    Types: 6 Cans of beer per week    Comment: occas  . Drug use: No  . Sexual activity: Yes

## 2019-06-11 ENCOUNTER — Telehealth: Payer: Self-pay | Admitting: Physical Medicine and Rehabilitation

## 2019-06-11 NOTE — Telephone Encounter (Signed)
ok 

## 2019-06-11 NOTE — Telephone Encounter (Signed)
Is auth needed? Scheduled for 5/20 with driver.

## 2019-06-11 NOTE — Telephone Encounter (Signed)
Patient is requesting a back injection. Right S1 TF 08/10/2018. Ok to repeat if helped, same problem/side, and no new injury?

## 2019-06-15 NOTE — Telephone Encounter (Signed)
[id number], Injection(s), anesthetic agent and/or more  Notification/Prior Authorization not required if procedure performed in Office; otherwise may be required for this service. 

## 2019-07-01 ENCOUNTER — Encounter: Payer: 59 | Admitting: Physical Medicine and Rehabilitation

## 2019-07-08 ENCOUNTER — Encounter: Payer: Self-pay | Admitting: Physical Medicine and Rehabilitation

## 2019-07-08 ENCOUNTER — Ambulatory Visit (INDEPENDENT_AMBULATORY_CARE_PROVIDER_SITE_OTHER): Payer: 59 | Admitting: Physical Medicine and Rehabilitation

## 2019-07-08 ENCOUNTER — Other Ambulatory Visit: Payer: Self-pay

## 2019-07-08 ENCOUNTER — Ambulatory Visit: Payer: Self-pay

## 2019-07-08 VITALS — BP 122/73 | HR 71

## 2019-07-08 DIAGNOSIS — M5416 Radiculopathy, lumbar region: Secondary | ICD-10-CM | POA: Diagnosis not present

## 2019-07-08 MED ORDER — METHYLPREDNISOLONE ACETATE 80 MG/ML IJ SUSP
80.0000 mg | Freq: Once | INTRAMUSCULAR | Status: AC
  Administered 2019-07-08: 80 mg

## 2019-07-08 NOTE — Procedures (Signed)
S1 Lumbosacral Transforaminal Epidural Steroid Injection - Sub-Pedicular Approach with Fluoroscopic Guidance   Patient: Parker Carey      Date of Birth: [birth year] MRN: [id number] PCP: Waldon Merl, PA-C      Visit Date: 07/08/2019   Universal Protocol:    Date/Time: 05/27/218:44 AM  Consent Given By: the patient  Position:  PRONE  Additional Comments: Vital signs were monitored before and after the procedure. Patient was prepped and draped in the usual sterile fashion. The correct patient, procedure, and site was verified.   Injection Procedure Details:  Procedure Site One Meds Administered:  Meds ordered this encounter  Medications  . methylPREDNISolone acetate (DEPO-MEDROL) injection 80 mg    Laterality: Right  Location/Site:  S1 Foramen   Needle size: 22 ga.  Needle type: Spinal  Needle Placement: Transforaminal  Findings:   -Comments: Excellent flow of contrast along the nerve and into the epidural space.  Epidurogram: Contrast epidurogram showed no nerve root cut off or restricted flow pattern.  Procedure Details: After squaring off the sacral end-plate to get a true AP view, the C-arm was positioned so that the best possible view of the S1 foramen was visualized. The soft tissues overlying this structure were infiltrated with 2-3 ml. of 1% Lidocaine without Epinephrine.    The spinal needle was inserted toward the target using a "trajectory" view along the fluoroscope beam.  Under AP and lateral visualization, the needle was advanced so it did not puncture dura. Biplanar projections were used to confirm position. Aspiration was confirmed to be negative for CSF and/or blood. A 1-2 ml. volume of Isovue-250 was injected and flow of contrast was noted at each level. Radiographs were obtained for documentation purposes.   After attaining the desired flow of contrast documented above, a 0.5 to 1.0 ml test dose of 0.25% Marcaine was injected into each  respective transforaminal space.  The patient was observed for 90 seconds post injection.  After no sensory deficits were reported, and normal lower extremity motor function was noted,   the above injectate was administered so that equal amounts of the injectate were placed at each foramen (level) into the transforaminal epidural space.   Additional Comments:  The patient tolerated the procedure well Dressing: Band-Aid with 2 x 2 sterile gauze    Post-procedure details: Patient was observed during the procedure. Post-procedure instructions were reviewed.  Patient left the clinic in stable condition.

## 2019-07-08 NOTE — Progress Notes (Signed)
Pt states pain is in the right lower back with some leg pain. Pt states taking it easy helps pain. Pt states laying down and his job increase pain    Numeric Pain Rating Scale and Functional Assessment Average Pain (3)   In the last MONTH (on 0-10 scale) has pain interfered with the following?  1. General activity like being  able to carry out your everyday physical activities such as walking, climbing stairs, carrying groceries, or moving a chair?  Rating(5)   +Driver, -BT, -Dye Allergies.

## 2019-07-09 NOTE — Progress Notes (Signed)
IDEN STRIPLING - 34 y.o. male MRN [id number]  Date of birth: May 17, 1985  Office Visit Note: Visit Date: 07/08/2019 PCP: Waldon Merl, PA-C Referred by: Waldon Merl, PA-C  Subjective: Chief Complaint  Patient presents with  . Lower Back - Pain   HPI:  Parker Carey is a 34 y.o. male who comes in today for planned Right S1-2 lumbar epidural steroid injection with fluoroscopic guidance.  The patient has failed conservative care including home exercise, medications, time and activity modification.  This injection will be diagnostic and hopefully therapeutic.  Please see requesting physician notes for further details and justification.  Prior injection performed in June of last year gave the patient really good relief up until just the last few months. He has had worsening similar right low back and posterior leg pain. He has had no new trauma no new injury no focal weakness etc. Got excellent relief for many months with prior injection.   ROS Otherwise per HPI.  Assessment & Plan: Visit Diagnoses:  1. Lumbar radiculopathy     Plan: No additional findings.   Meds & Orders:  Meds ordered this encounter  Medications  . methylPREDNISolone acetate (DEPO-MEDROL) injection 80 mg    Orders Placed This Encounter  Procedures  . XR C-ARM NO REPORT  . Epidural Steroid injection    Follow-up: Return if symptoms worsen or fail to improve.   Procedures: No procedures performed  S1 Lumbosacral Transforaminal Epidural Steroid Injection - Sub-Pedicular Approach with Fluoroscopic Guidance   Patient: Parker Carey      Date of Birth: 12-18-1985 MRN: [id number] PCP: Waldon Merl, PA-C      Visit Date: 07/08/2019   Universal Protocol:    Date/Time: 05/27/218:44 AM  Consent Given By: the patient  Position:  PRONE  Additional Comments: Vital signs were monitored before and after the procedure. Patient was prepped and draped in the usual sterile fashion. The  correct patient, procedure, and site was verified.   Injection Procedure Details:  Procedure Site One Meds Administered:  Meds ordered this encounter  Medications  . methylPREDNISolone acetate (DEPO-MEDROL) injection 80 mg    Laterality: Right  Location/Site:  S1 Foramen   Needle size: 22 ga.  Needle type: Spinal  Needle Placement: Transforaminal  Findings:   -Comments: Excellent flow of contrast along the nerve and into the epidural space.  Epidurogram: Contrast epidurogram showed no nerve root cut off or restricted flow pattern.  Procedure Details: After squaring off the sacral end-plate to get a true AP view, the C-arm was positioned so that the best possible view of the S1 foramen was visualized. The soft tissues overlying this structure were infiltrated with 2-3 ml. of 1% Lidocaine without Epinephrine.    The spinal needle was inserted toward the target using a "trajectory" view along the fluoroscope beam.  Under AP and lateral visualization, the needle was advanced so it did not puncture dura. Biplanar projections were used to confirm position. Aspiration was confirmed to be negative for CSF and/or blood. A 1-2 ml. volume of Isovue-250 was injected and flow of contrast was noted at each level. Radiographs were obtained for documentation purposes.   After attaining the desired flow of contrast documented above, a 0.5 to 1.0 ml test dose of 0.25% Marcaine was injected into each respective transforaminal space.  The patient was observed for 90 seconds post injection.  After no sensory deficits were reported, and normal lower extremity motor function was noted,   the  above injectate was administered so that equal amounts of the injectate were placed at each foramen (level) into the transforaminal epidural space.   Additional Comments:  The patient tolerated the procedure well Dressing: Band-Aid with 2 x 2 sterile gauze    Post-procedure details: Patient was observed  during the procedure. Post-procedure instructions were reviewed.  Patient left the clinic in stable condition.     Clinical History: MRI LUMBAR SPINE WITHOUT CONTRAST  TECHNIQUE: Multiplanar, multisequence MR imaging of the lumbar spine was performed. No intravenous contrast was administered.  COMPARISON:  Lumbar radiographs 04/21/2018  FINDINGS: Segmentation:  Normal  Alignment:  Normal  Vertebrae:  Normal bone marrow.  Negative for fracture or mass.  Conus medullaris and cauda equina: Conus extends to the L1 level. Conus and cauda equina appear normal.  Paraspinal and other soft tissues: Negative for paraspinous mass or edema  Disc levels:  L1-2: Negative  L2-3: Negative  L3-4: Negative  L4-5: Mild disc and mild facet degeneration. Negative for disc protrusion or stenosis  L5-S1: Shallow central disc protrusion. Negative for stenosis or neural impingement.  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 Negative for neural impingement or spinal stenosis.   Electronically Signed   By: Franchot Gallo M.D.   On: 06/30/2018 12:50     Objective:  VS:  HT:    WT:   BMI:     BP:122/73  HR:71bpm  TEMP: ( )  RESP:  Physical Exam Constitutional:      General: He is not in acute distress.    Appearance: Normal appearance. He is not ill-appearing.  HENT:     Head: Normocephalic and atraumatic.     Right Ear: External ear normal.     Left Ear: External ear normal.  Eyes:     Extraocular Movements: Extraocular movements intact.  Cardiovascular:     Rate and Rhythm: Normal rate.     Pulses: Normal pulses.  Abdominal:     General: There is no distension.     Palpations: Abdomen is soft.  Musculoskeletal:        General: No tenderness or signs of injury.     Right lower leg: No edema.     Left lower leg: No edema.     Comments: Patient has good distal strength without clonus.  Skin:    Findings: No erythema or rash.   Neurological:     General: No focal deficit present.     Mental Status: He is alert and oriented to person, place, and time.     Sensory: No sensory deficit.     Motor: No weakness or abnormal muscle tone.     Coordination: Coordination normal.  Psychiatric:        Mood and Affect: Mood normal.        Behavior: Behavior normal.      Imaging: XR C-ARM NO REPORT  Result Date: 07/08/2019 Please see Notes tab for imaging impression.

## 2019-08-03 ENCOUNTER — Other Ambulatory Visit: Payer: Self-pay

## 2019-08-03 ENCOUNTER — Ambulatory Visit (INDEPENDENT_AMBULATORY_CARE_PROVIDER_SITE_OTHER): Payer: 59 | Admitting: Physician Assistant

## 2019-08-03 ENCOUNTER — Encounter: Payer: Self-pay | Admitting: Physician Assistant

## 2019-08-03 VITALS — BP 118/66 | HR 83 | Temp 98.2°F | Ht 67.0 in | Wt 164.2 lb

## 2019-08-03 DIAGNOSIS — M778 Other enthesopathies, not elsewhere classified: Secondary | ICD-10-CM | POA: Diagnosis not present

## 2019-08-03 DIAGNOSIS — M7522 Bicipital tendinitis, left shoulder: Secondary | ICD-10-CM | POA: Diagnosis not present

## 2019-08-03 MED ORDER — CYCLOBENZAPRINE HCL 10 MG PO TABS: 10.0000 mg | ORAL_TABLET | Freq: Every day | ORAL | 0 refills | Status: DC

## 2019-08-03 MED ORDER — MELOXICAM 15 MG PO TABS: 15.0000 mg | ORAL_TABLET | Freq: Every day | ORAL | 0 refills | Status: DC

## 2019-08-03 NOTE — Progress Notes (Signed)
Patient presents to clinic today pain in L upper arm x 1 week, described as both sharp and achy pain ranging from just above the elbow to the top of his shoulder.  Denies any bruising, redness or swelling.  Has been working on his boat, doing a lot of heavy lifting and repetitive motions which she feels has set these symptoms off.  Notes some pain with lifting.  Notes that the pain is significant and he feels a slight tremor in his muscle.  Has been taking naproxen several times throughout the day with only some relief in symptoms.  Denies neck pain or headache.  Denies symptoms of right upper extremity..    Past Medical History:  Diagnosis Date  . Back pain    L-5- S-1 back pain  . History of chickenpox   . History of kidney stones   . Kidney stones   . SVT (supraventricular tachycardia) ([facility])     Current Outpatient Medications on File Prior to Visit  Medication Sig Dispense Refill  . B-D 3CC LUER-LOK SYR 21GX1-1/2 21G X 1-1/2" 3 ML MISC U TO ADMINISTER TESTOSTERONE INJECTION Q 2 WEEKS    . Cholecalciferol (VITAMIN D) 2000 units tablet Take 1 tablet (2,000 Units total) by mouth daily. 30 tablet 0  . fluticasone (FLONASE) 50 MCG/ACT nasal spray SHAKE LIQUID AND USE 2 SPRAYS IN EACH NOSTRIL DAILY 16 g 6  . nabumetone (RELAFEN) 750 MG tablet Take 1 tablet (750 mg total) by mouth 2 (two) times daily as needed. 60 tablet 6  . Omega-3 Fatty Acids (FISH OIL) 1200 MG CAPS Take 1 capsule (1,200 mg total) by mouth daily. 30 capsule 0  . testosterone cypionate (DEPOTESTOSTERONE CYPIONATE) 200 MG/ML injection INJECT INTO THE MUSCLE EVERY 2 WEEKS     No current facility-administered medications on file prior to visit.    No Known Allergies  Family History  Problem Relation Age of Onset  . Healthy Mother   . Healthy Father   . Cancer Maternal Grandmother   . Hyperlipidemia Maternal Grandmother     Social History   Socioeconomic History  . Marital status: Married    Spouse name: Not  on file  . Number of children: Not on file  . Years of education: Not on file  . Highest education level: Not on file  Occupational History  . Occupation: Plumber  Tobacco Use  . Smoking status: Former Smoker    Years: 9.00    Types: Cigarettes  . Smokeless tobacco: Former Neurosurgeon    Types: Engineer, drilling  . Vaping Use: Never used  Substance and Sexual Activity  . Alcohol use: Yes    Alcohol/week: 6.0 standard drinks    Types: 6 Cans of beer per week    Comment: occas  . Drug use: No  . Sexual activity: Yes  Other Topics Concern  . Not on file  Social History Narrative  . Not on file   Social Determinants of Health   Financial Resource Strain:   . Difficulty of Paying Living Expenses:   Food Insecurity:   . Worried About Programme researcher, broadcasting/film/video in the Last Year:   . Barista in the Last Year:   Transportation Needs:   . Freight forwarder (Medical):   Marland Kitchen Lack of Transportation (Non-Medical):   Physical Activity:   . Days of Exercise per Week:   . Minutes of Exercise per Session:   Stress:   . Feeling of Stress :  Social Connections:   . Frequency of Communication with Friends and Family:   . Frequency of Social Gatherings with Friends and Family:   . Attends Religious Services:   . Active Member of Clubs or Organizations:   . Attends Archivist Meetings:   Marland Kitchen Marital Status:     Review of Systems - See HPI.  All other ROS are negative.  BP 118/66   Pulse 83   Temp 98.2 F (36.8 C)   Ht 5\' 7"  (1.702 m)   Wt 164 lb 3.2 oz (74.5 kg)   SpO2 100%   BMI 25.72 kg/m   Physical Exam Vitals reviewed.  Constitutional:      Appearance: Normal appearance.  Cardiovascular:     Rate and Rhythm: Normal rate and regular rhythm.     Heart sounds: Normal heart sounds.  Pulmonary:     Effort: Pulmonary effort is normal.  Musculoskeletal:     Left shoulder: Normal.     Left upper arm: Tenderness present. No swelling, edema, deformity, lacerations or  bony tenderness.     Left elbow: Normal.     Cervical back: Neck supple.     Comments: Significant tenderness at origins of both heads of the biceps tendon. Triceps tenderness also noted but less significant. Normal ROM of arm and shoulder.  Neurological:     Mental Status: He is alert.     Assessment/Plan: 1. Biceps tendinitis, left 2. Triceps tendinitis Significant tenderness with palpation over biceps brachii tendon and triceps brachii tendons.  Rotator cuff examination within normal limits.  Mild tenderness noted in the musculature itself.  Suspect significant overuse.  Patient to rest, avoiding any heavy lifting or overexertion.  Rx meloxicam to take once daily with food.  Tylenol for breakthrough pain.  Flexeril to use at bedtime.  We will have him alternate ice and heat to the area in 10 to 15-minute intervals each, a few times per day.  Can consider topical icy hot or Aspercreme but is not to apply heating pad over these.  If not improving significantly or if anything worsens we will have him evaluated by sports medicine and physical therapy.  Patient voiced understanding and agreement with the plan.  Handout of written instructions was provided for the patient.  - cyclobenzaprine (FLEXERIL) 10 MG tablet; Take 1 tablet (10 mg total) by mouth at bedtime.  Dispense: 15 tablet; Refill: 0 - meloxicam (MOBIC) 15 MG tablet; Take 1 tablet (15 mg total) by mouth daily.  Dispense: 30 tablet; Refill: 0   This visit occurred during the SARS-CoV-2 public health emergency.  Safety protocols were in place, including screening questions prior to the visit, additional usage of staff PPE, and extensive cleaning of exam room while observing appropriate contact time as indicated for disinfecting solutions.     Leeanne Rio, PA-C

## 2019-08-03 NOTE — Patient Instructions (Signed)
Please avoid heavy lifting and overexertion. Continue alternating ice and heat to the upper arm, 10-15 minutes intervals each, a few times per day.   Take the meloxicam once daily with food. Tylenol for breakthrough pain. The Flexeril is to take at night. You can take up to 3 x daily but no driving or operating heavy machinery when taking in the day.  I want you to message me in 7-10 days to let me know how things are going. If no substantial improvement we would want to set up Sports medicine assessment and physical therapy.   Hang in there!

## 2019-08-30 ENCOUNTER — Other Ambulatory Visit: Payer: Self-pay | Admitting: Physician Assistant

## 2019-08-30 DIAGNOSIS — M7522 Bicipital tendinitis, left shoulder: Secondary | ICD-10-CM

## 2019-08-30 DIAGNOSIS — M778 Other enthesopathies, not elsewhere classified: Secondary | ICD-10-CM

## 2019-08-30 NOTE — Telephone Encounter (Signed)
LFD 08/03/19 #30 with no refills LOV 08/03/19 NOV none

## 2020-02-23 ENCOUNTER — Telehealth: Payer: Self-pay | Admitting: Physical Medicine and Rehabilitation

## 2020-02-23 NOTE — Telephone Encounter (Signed)
ok 

## 2020-02-23 NOTE — Telephone Encounter (Signed)
Pt would like to get scheduled for a repeat cortisone injection please  [phone]

## 2020-02-23 NOTE — Telephone Encounter (Signed)
Patient had Rt S1 TF on 07/08/19. Ok to repeat if helped, same problem/side, and no new injury? 

## 2020-02-24 NOTE — Telephone Encounter (Signed)
Pt not req Auth#. 

## 2020-02-24 NOTE — Telephone Encounter (Signed)
Called pt and sch 2/7 

## 2020-02-24 NOTE — Telephone Encounter (Signed)
Called pt and LVM #1 

## 2020-03-20 ENCOUNTER — Other Ambulatory Visit: Payer: Self-pay

## 2020-03-20 ENCOUNTER — Ambulatory Visit: Payer: Self-pay

## 2020-03-20 ENCOUNTER — Ambulatory Visit (INDEPENDENT_AMBULATORY_CARE_PROVIDER_SITE_OTHER): Payer: 59 | Admitting: Physical Medicine and Rehabilitation

## 2020-03-20 ENCOUNTER — Encounter: Payer: Self-pay | Admitting: Physical Medicine and Rehabilitation

## 2020-03-20 VITALS — BP 125/87 | HR 81

## 2020-03-20 DIAGNOSIS — M5416 Radiculopathy, lumbar region: Secondary | ICD-10-CM

## 2020-03-20 MED ORDER — METHYLPREDNISOLONE ACETATE 80 MG/ML IJ SUSP
80.0000 mg | Freq: Once | INTRAMUSCULAR | Status: AC
  Administered 2020-03-20: 80 mg

## 2020-03-20 NOTE — Progress Notes (Signed)
Parker Carey - 35 y.o. male MRN [id number]  Date of birth: [birth year]  Office Visit Note: Visit Date: 03/20/2020 PCP: Waldon Merl, PA-C Referred by: Waldon Merl, PA-C  Subjective: Chief Complaint  Patient presents with  . Lower Back - Pain   HPI:  Parker Carey is a 35 y.o. male who comes in today for planned repeat Right S1-2 Lumbar epidural steroid injection with fluoroscopic guidance.  The patient has failed conservative care including home exercise, medications, time and activity modification.  This injection will be diagnostic and hopefully therapeutic.  Please see requesting physician notes for further details and justification. Patient received more than 50% pain relief from prior injection.   Referring: Dr. Casimiro Needle Hilts    ROS Otherwise per HPI.  Assessment & Plan: Visit Diagnoses:    ICD-10-CM   1. Lumbar radiculopathy  M54.16 XR C-ARM NO REPORT    Epidural Steroid injection    methylPREDNISolone acetate (DEPO-MEDROL) injection 80 mg    Plan: No additional findings.   Meds & Orders:  Meds ordered this encounter  Medications  . methylPREDNISolone acetate (DEPO-MEDROL) injection 80 mg    Orders Placed This Encounter  Procedures  . XR C-ARM NO REPORT  . Epidural Steroid injection    Follow-up: Return if symptoms worsen or fail to improve.   Procedures: No procedures performed  S1 Lumbosacral Transforaminal Epidural Steroid Injection - Sub-Pedicular Approach with Fluoroscopic Guidance   Patient: Parker Carey      Date of Birth: Nov 10, 1985 MRN: [id number] PCP: Waldon Merl, PA-C      Visit Date: 03/20/2020   Universal Protocol:    Date/Time: 02/07/229:39 AM  Consent Given By: the patient  Position:  PRONE  Additional Comments: Vital signs were monitored before and after the procedure. Patient was prepped and draped in the usual sterile fashion. The correct patient, procedure, and site was verified.   Injection  Procedure Details:  Procedure Site One Meds Administered:  Meds ordered this encounter  Medications  . methylPREDNISolone acetate (DEPO-MEDROL) injection 80 mg    Laterality: Right  Location/Site:  S1 Foramen   Needle size: 22 ga.  Needle type: Spinal  Needle Placement: Transforaminal  Findings:   -Comments: Excellent flow of contrast along the nerve, nerve root and into the epidural space.  Epidurogram: Contrast epidurogram showed no nerve root cut off or restricted flow pattern.  Procedure Details: After squaring off the sacral end-plate to get a true AP view, the C-arm was positioned so that the best possible view of the S1 foramen was visualized. The soft tissues overlying this structure were infiltrated with 2-3 ml. of 1% Lidocaine without Epinephrine.    The spinal needle was inserted toward the target using a "trajectory" view along the fluoroscope beam.  Under AP and lateral visualization, the needle was advanced so it did not puncture dura. Biplanar projections were used to confirm position. Aspiration was confirmed to be negative for CSF and/or blood. A 1-2 ml. volume of Isovue-250 was injected and flow of contrast was noted at each level. Radiographs were obtained for documentation purposes.   After attaining the desired flow of contrast documented above, a 0.5 to 1.0 ml test dose of 0.25% Marcaine was injected into each respective transforaminal space.  The patient was observed for 90 seconds post injection.  After no sensory deficits were reported, and normal lower extremity motor function was noted,   the above injectate was administered so that equal amounts of the injectate  were placed at each foramen (level) into the transforaminal epidural space.   Additional Comments:  The patient tolerated the procedure well Dressing: Band-Aid with 2 x 2 sterile gauze    Post-procedure details: Patient was observed during the procedure. Post-procedure instructions were  reviewed.  Patient left the clinic in stable condition.     Clinical History: MRI LUMBAR SPINE WITHOUT CONTRAST  TECHNIQUE: Multiplanar, multisequence MR imaging of the lumbar spine was performed. No intravenous contrast was administered.  COMPARISON:  Lumbar radiographs 04/21/2018  FINDINGS: Segmentation:  Normal  Alignment:  Normal  Vertebrae:  Normal bone marrow.  Negative for fracture or mass.  Conus medullaris and cauda equina: Conus extends to the L1 level. Conus and cauda equina appear normal.  Paraspinal and other soft tissues: Negative for paraspinous mass or edema  Disc levels:  L1-2: Negative  L2-3: Negative  L3-4: Negative  L4-5: Mild disc and mild facet degeneration. Negative for disc protrusion or stenosis  L5-S1: Shallow central disc protrusion. Negative for stenosis or neural impingement.  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 Negative for neural impingement or spinal stenosis.   Electronically Signed   By: Marlan Palau M.D.   On: 06/30/2018 12:50     Objective:  VS:  HT:    WT:   BMI:     BP:125/87  HR:81bpm  TEMP: ( )  RESP:  Physical Exam Vitals and nursing note reviewed.  Constitutional:      General: He is not in acute distress.    Appearance: Normal appearance. He is not ill-appearing.  HENT:     Head: Normocephalic and atraumatic.     Right Ear: External ear normal.     Left Ear: External ear normal.     Nose: No congestion.  Eyes:     Extraocular Movements: Extraocular movements intact.  Cardiovascular:     Rate and Rhythm: Normal rate.     Pulses: Normal pulses.  Pulmonary:     Effort: Pulmonary effort is normal. No respiratory distress.  Abdominal:     General: There is no distension.     Palpations: Abdomen is soft.  Musculoskeletal:        General: No tenderness or signs of injury.     Cervical back: Neck supple.     Right lower leg: No edema.     Left lower leg:  No edema.     Comments: Patient has good distal strength without clonus. Positive right slump.  Skin:    Findings: No erythema or rash.  Neurological:     General: No focal deficit present.     Mental Status: He is alert and oriented to person, place, and time.     Sensory: No sensory deficit.     Motor: No weakness or abnormal muscle tone.     Coordination: Coordination normal.  Psychiatric:        Mood and Affect: Mood normal.        Behavior: Behavior normal.      Imaging: No results found.

## 2020-03-20 NOTE — Progress Notes (Signed)
Pt state lower back pain, mostly on the right. Pt state sitting down and bending over. Pt state he takes over the counter pain meds and use icee hot to help ease the pain.  Numeric Pain Rating Scale and Functional Assessment Average Pain 1   In the last MONTH (on 0-10 scale) has pain interfered with the following?  1. General activity like being  able to carry out your everyday physical activities such as walking, climbing stairs, carrying groceries, or moving a chair?  Rating(4)   +Driver, -BT, -Dye Allergies.

## 2020-03-20 NOTE — Procedures (Signed)
S1 Lumbosacral Transforaminal Epidural Steroid Injection - Sub-Pedicular Approach with Fluoroscopic Guidance   Patient: Parker Carey      Date of Birth: 08-04-1985 MRN: [id number] PCP: Waldon Merl, PA-C      Visit Date: 03/20/2020   Universal Protocol:    Date/Time: 02/07/229:39 AM  Consent Given By: the patient  Position:  PRONE  Additional Comments: Vital signs were monitored before and after the procedure. Patient was prepped and draped in the usual sterile fashion. The correct patient, procedure, and site was verified.   Injection Procedure Details:  Procedure Site One Meds Administered:  Meds ordered this encounter  Medications  . methylPREDNISolone acetate (DEPO-MEDROL) injection 80 mg    Laterality: Right  Location/Site:  S1 Foramen   Needle size: 22 ga.  Needle type: Spinal  Needle Placement: Transforaminal  Findings:   -Comments: Excellent flow of contrast along the nerve, nerve root and into the epidural space.  Epidurogram: Contrast epidurogram showed no nerve root cut off or restricted flow pattern.  Procedure Details: After squaring off the sacral end-plate to get a true AP view, the C-arm was positioned so that the best possible view of the S1 foramen was visualized. The soft tissues overlying this structure were infiltrated with 2-3 ml. of 1% Lidocaine without Epinephrine.    The spinal needle was inserted toward the target using a "trajectory" view along the fluoroscope beam.  Under AP and lateral visualization, the needle was advanced so it did not puncture dura. Biplanar projections were used to confirm position. Aspiration was confirmed to be negative for CSF and/or blood. A 1-2 ml. volume of Isovue-250 was injected and flow of contrast was noted at each level. Radiographs were obtained for documentation purposes.   After attaining the desired flow of contrast documented above, a 0.5 to 1.0 ml test dose of 0.25% Marcaine was injected  into each respective transforaminal space.  The patient was observed for 90 seconds post injection.  After no sensory deficits were reported, and normal lower extremity motor function was noted,   the above injectate was administered so that equal amounts of the injectate were placed at each foramen (level) into the transforaminal epidural space.   Additional Comments:  The patient tolerated the procedure well Dressing: Band-Aid with 2 x 2 sterile gauze    Post-procedure details: Patient was observed during the procedure. Post-procedure instructions were reviewed.  Patient left the clinic in stable condition.

## 2020-03-20 NOTE — Patient Instructions (Signed)

## 2020-08-22 IMAGING — MR MR HEAD WO/W CM
15 of 20 series · 33 of 48 positions shown · IV contrast (10ml multihance)
Comparison: None.

CLINICAL DATA: Hypogonadotropic hypogonadism treated with
testosterone.

EXAM:
MRI HEAD WITHOUT AND WITH CONTRAST
TECHNIQUE: Multiplanar, multiecho pulse sequences of the brain and surrounding
structures were obtained without and with intravenous contrast.
CONTRAST:  10mL MULTIHANCE GADOBENATE DIMEGLUMINE 529 MG/ML IV SOLN

[Series 2: T1 · sagittal · 5.0mm · 0.47mm/px · 3 of 24 slices shown]
[im 1/24]
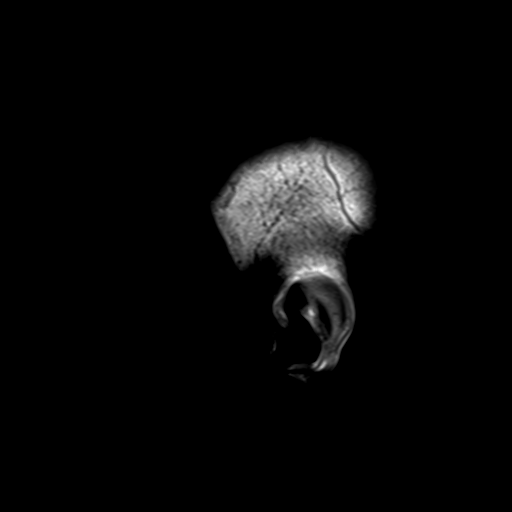
[im 12/24]
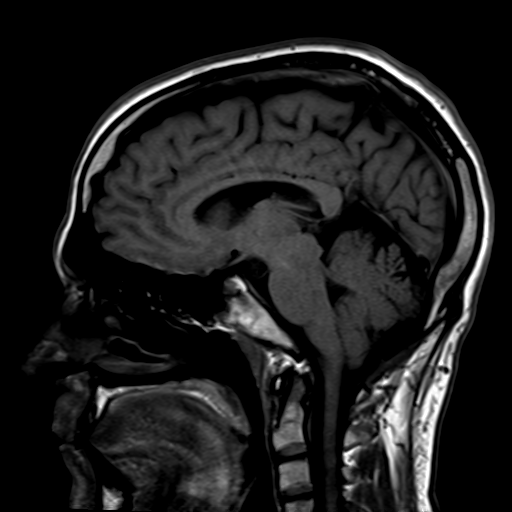
[im 24/24]
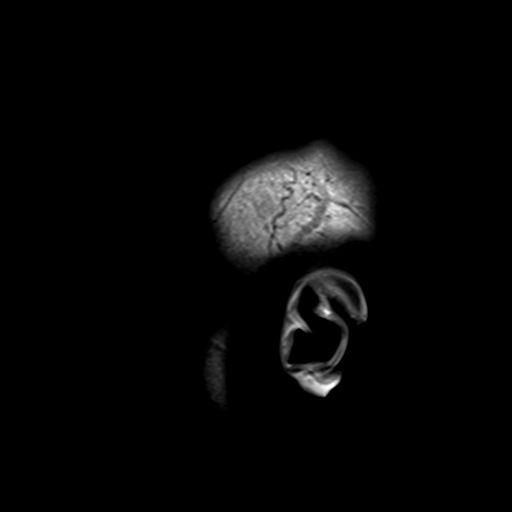

[Series 3: DWI · axial · 3.0mm · 1.88mm/px · z∈[-36,+125]mm · 8 of 110 slices shown]
[im 1/110]
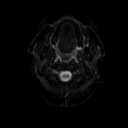
[im 13/110]
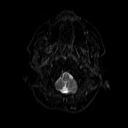
[im 37/110]
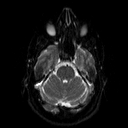
[im 49/110]
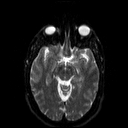
[im 61/110]
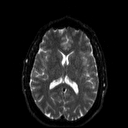
[im 73/110]
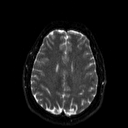
[im 97/110]
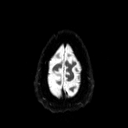
[im 110/110]
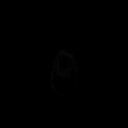

[Series 4: dwi_adc · axial · 3.0mm · 1.88mm/px · z∈[-36,+125]mm · 4 of 51 slices shown]
[im 1/51]
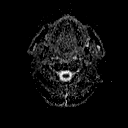
[im 17/51]
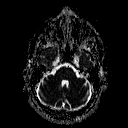
[im 34/51]
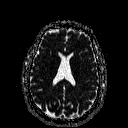
[im 51/51]
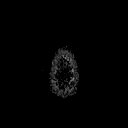

[Series 5: T2 · axial · 5.0mm · 0.38mm/px · z∈[-34,+127]mm · 2 of 26 slices shown]
[im 1/26]
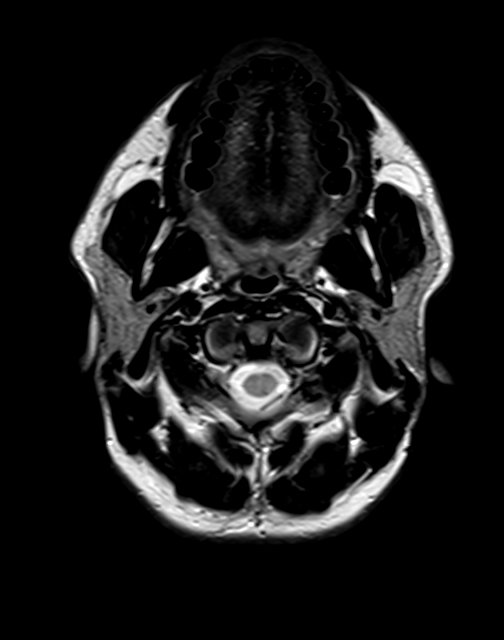
[im 26/26]
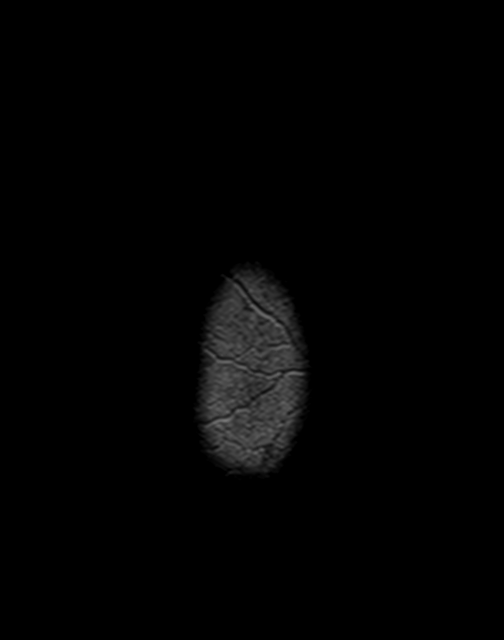

[Series 6: FLAIR · axial · 3.0mm · 0.47mm/px · z∈[-36,+125]mm · 3 of 36 slices shown]
[im 1/36]
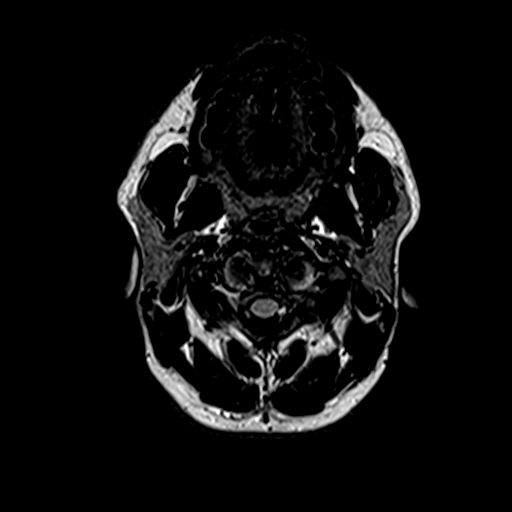
[im 18/36]
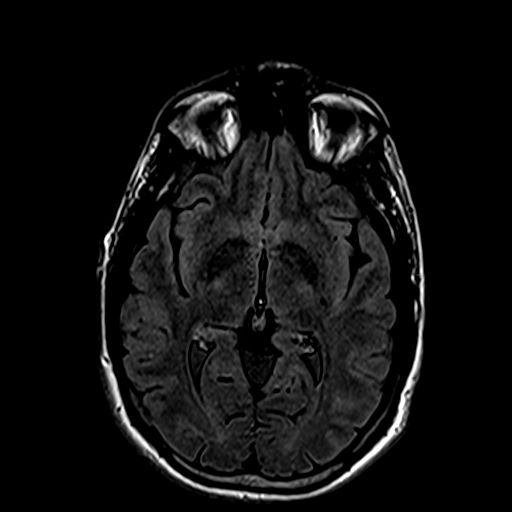
[im 36/36]
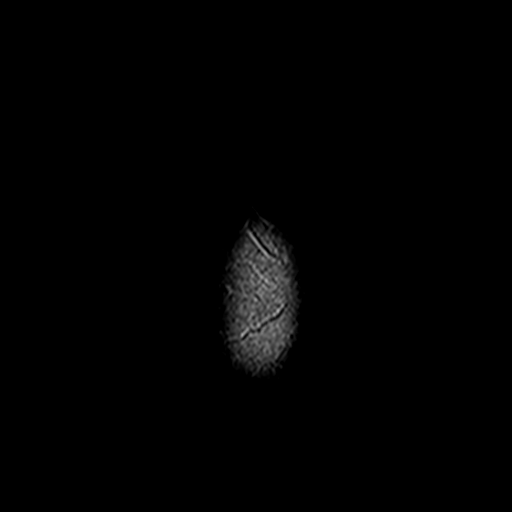

[Series 7: axial grad (blood) · axial · 5.0mm · 0.47mm/px · z∈[-30,+125]mm · 2 of 25 slices shown]
[im 1/25]
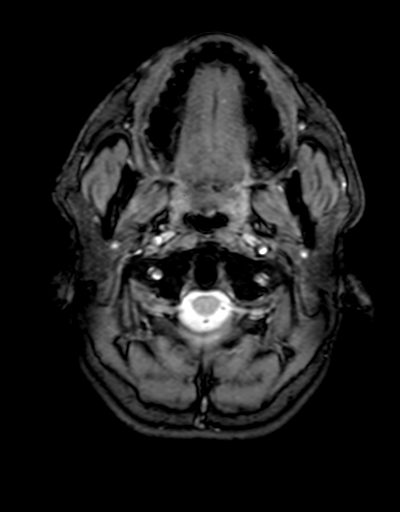
[im 25/25]
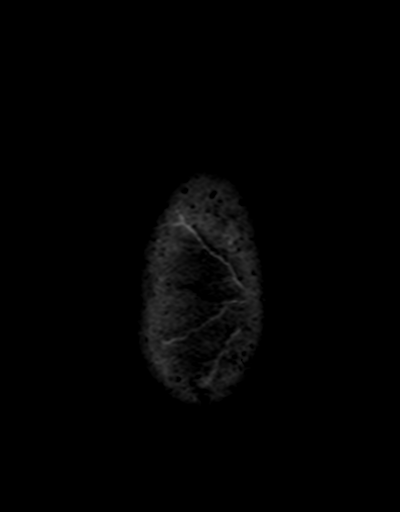

[Series 9: swi_images · axial · 4.0mm · 0.94mm/px · z∈[-29,+125]mm · 3 of 40 slices shown]
[im 1/40]
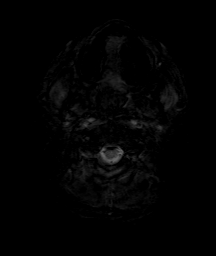
[im 20/40]
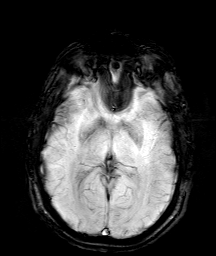
[im 40/40]
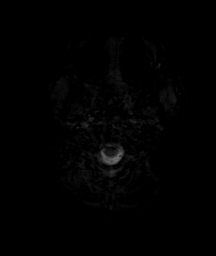

[Series 10: sag 3mm · sagittal · 3.0mm · 0.33mm/px · 1 of 16 slices shown]
[im 1/16]
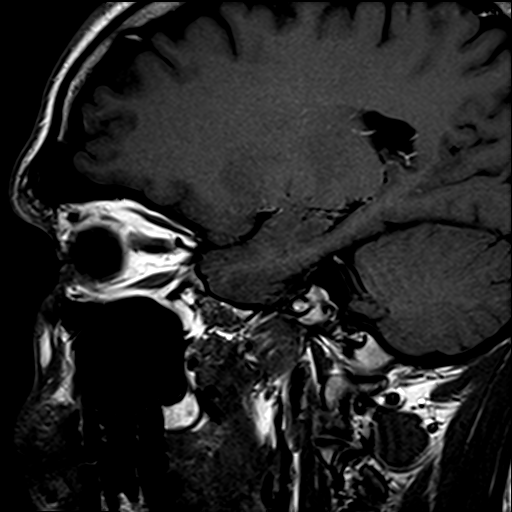

[Series 11: cor 3mm · coronal · 3.0mm · 0.33mm/px · 1 of 16 slices shown]
[im 1/16]
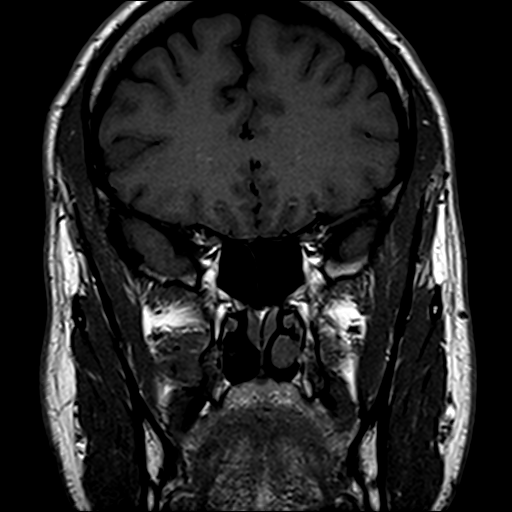

[Series 12: pre cor dynamic · coronal · non-contrast · 3.0mm · 0.35mm/px · 1 of 12 slices shown]
[im 1/12]
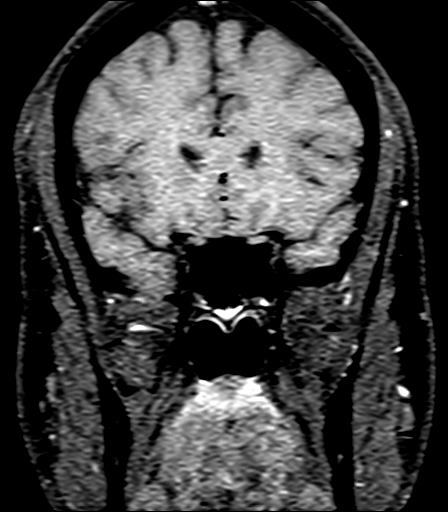

[Series 13: post fs cor · coronal · 3.0mm · 0.35mm/px · 1 of 12 slices shown (1 of 5)]
[im 1/12]
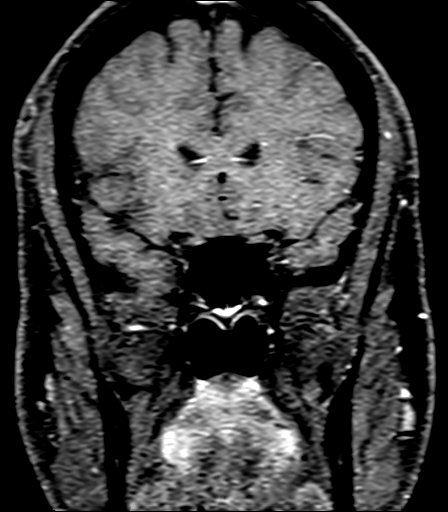

[Series 14: post fs cor · coronal · 3.0mm · 0.35mm/px · 1 of 12 slices shown (2 of 5)]
[im 1/12]
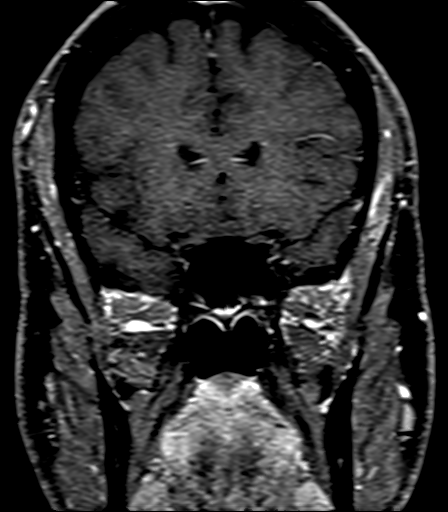

[Series 15: post fs cor · coronal · 3.0mm · 0.35mm/px · 1 of 12 slices shown (3 of 5)]
[im 1/12]
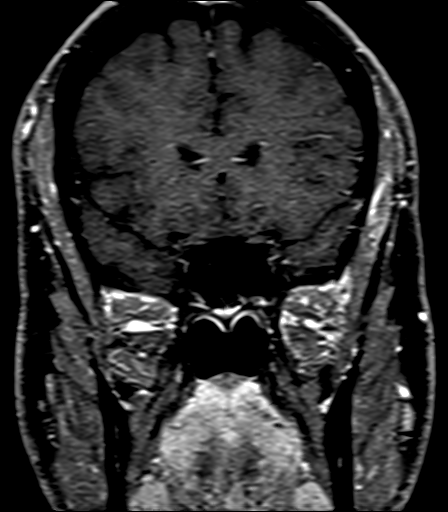

[Series 16: post fs cor · coronal · 3.0mm · 0.35mm/px · 1 of 12 slices shown (4 of 5)]
[im 1/12]
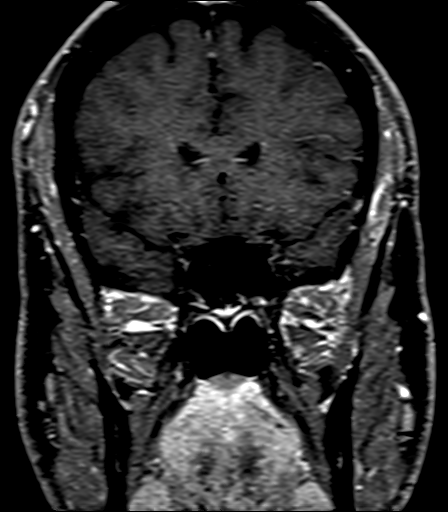

[Series 17: post fs cor · coronal · 3.0mm · 0.35mm/px · 1 of 12 slices shown (5 of 5)]
[im 1/12]
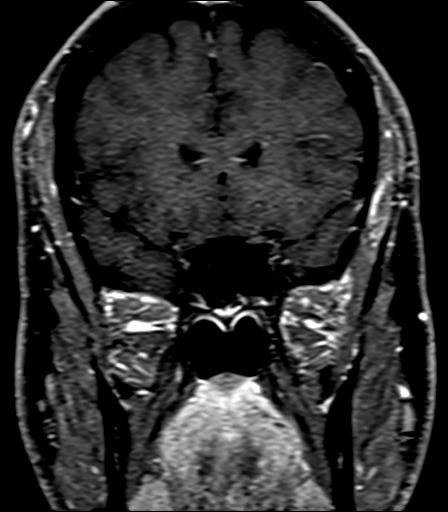

[33 of 48 positions shown; findings below may reference images not displayed]

FINDINGS: SELLA: No abnormal sellar expansion. Normal posterior pituitary
bright spot. Small pituitary gland with mild flattening by cerebral
spinal fluid. No early foci of suspicious hypo enhancement.
Pituitary stalk is slightly deviated LEFT with normal enhancement.
Symmetric appearance of the cavernous sinuses and cavernous sinus
flow voids. Normal appearance of the overlying optic chiasm.

INTRACRANIAL CONTENTS: No reduced diffusion to suggest acute
ischemia, hyperacute demyelination or hypercellular tumor. The
ventricles and sulci are normal for patient's age. Tiny FLAIR T2
hyperintense LEFT periatrial vessel flow related enhancement, normal
finding. No suspicious parenchymal signal, mass lesions, mass
effect. No abnormal intraparenchymal or extra-axial enhancement. No
abnormal extra-axial fluid collections.

VASCULAR: Normal major intracranial vascular flow voids present at
skull base.

ORBITS: The ocular globes and orbital contents are non-suspicious.

SINUSES: The mastoid air-cells and included paranasal sinuses are
well-aerated.

SKULL/SOFT TISSUES: No suspicious calvarial bone marrow signal.
Craniocervical junction maintained.
IMPRESSION: 1. Mild partially empty sella which can be a normal variant. No
sellar mass.
2. Otherwise negative MRI of the head with and without contrast.

## 2020-09-15 IMAGING — DX DG ABDOMEN 1V
1 series · 1 of 1 positions shown · non-contrast
Comparison: Body CT 03/27/2018

CLINICAL DATA: Pre lithotripsy.

EXAM:
ABDOMEN - 1 VIEW

[abdomen kub]
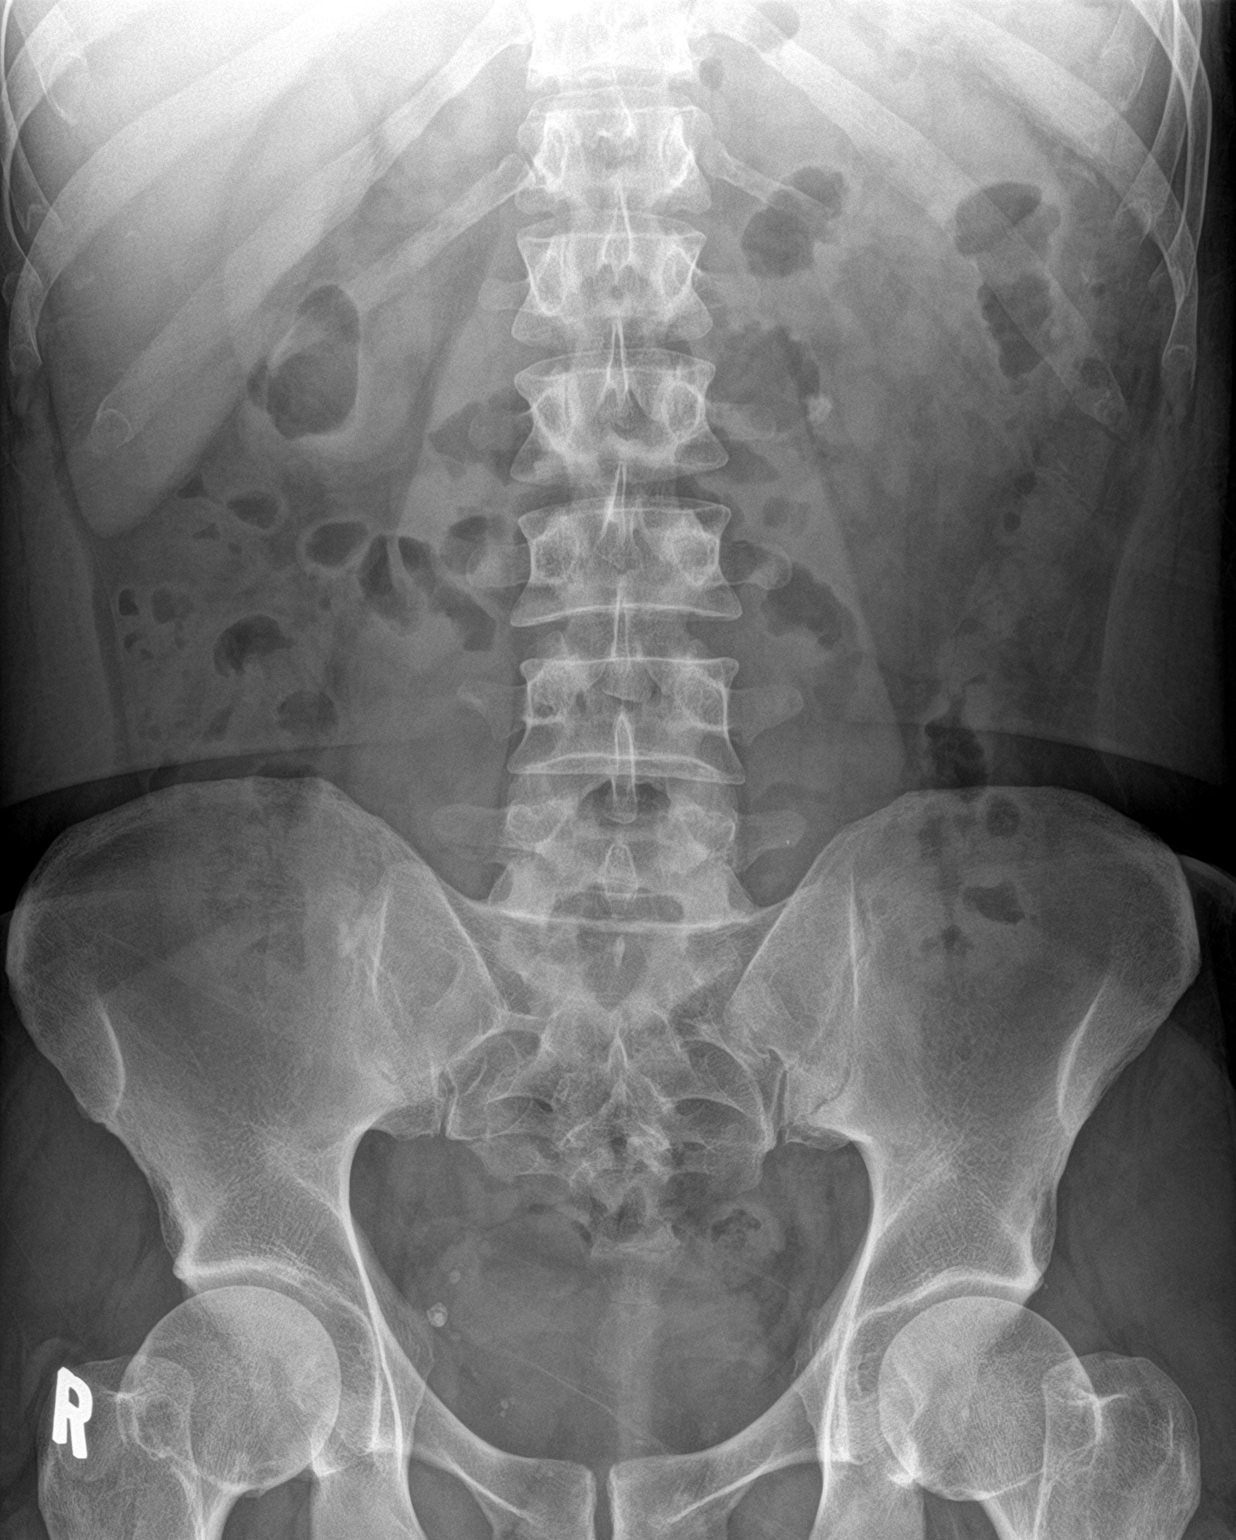

[1 of 1 positions shown; findings below may reference images not displayed]

FINDINGS: The bowel gas pattern is normal.

Redemonstrated is 11 mm left proximal ureteral calculus.

Normal osseous structures and soft tissues.
IMPRESSION: 11 mm left proximal ureteral calculus, in stable position,
accounting for cross modality comparison.

## 2021-01-19 ENCOUNTER — Telehealth: Payer: Self-pay | Admitting: Physical Medicine and Rehabilitation

## 2021-01-19 NOTE — Telephone Encounter (Signed)
Pt's wife Efraim Kaufmann called to set up back injection for herslef and husband. Sent message for wife acc. Please call to set an appt at [phone].

## 2021-02-19 ENCOUNTER — Ambulatory Visit: Payer: 59 | Admitting: Physical Medicine and Rehabilitation

## 2021-02-22 ENCOUNTER — Other Ambulatory Visit: Payer: Self-pay

## 2021-02-22 ENCOUNTER — Ambulatory Visit (INDEPENDENT_AMBULATORY_CARE_PROVIDER_SITE_OTHER): Payer: 59 | Admitting: Physical Medicine and Rehabilitation

## 2021-02-22 ENCOUNTER — Ambulatory Visit: Payer: Self-pay

## 2021-02-22 ENCOUNTER — Encounter: Payer: Self-pay | Admitting: Physical Medicine and Rehabilitation

## 2021-02-22 VITALS — BP 127/80 | HR 90

## 2021-02-22 DIAGNOSIS — M5416 Radiculopathy, lumbar region: Secondary | ICD-10-CM

## 2021-02-22 MED ORDER — METHYLPREDNISOLONE ACETATE 80 MG/ML IJ SUSP
80.0000 mg | Freq: Once | INTRAMUSCULAR | Status: AC
  Administered 2021-02-22: 80 mg

## 2021-02-22 NOTE — Progress Notes (Signed)
Pt state lower back pain that travels to his right buttock  down his right stops at mid thigh. Pt state walking, standing and bending makes the pain worse. Pt state he takes over the counter pain meds to help ease his pain.  Numeric Pain Rating Scale and Functional Assessment Average Pain 1   In the last MONTH (on 0-10 scale) has pain interfered with the following?  1. General activity like being  able to carry out your everyday physical activities such as walking, climbing stairs, carrying groceries, or moving a chair?  Rating(4)   +Driver, -BT, -Dye Allergies.

## 2021-02-22 NOTE — Progress Notes (Signed)
Parker Carey - 36 y.o. male MRN [id number]  Date of birth: 26-Mar-1985  Office Visit Note: Visit Date: 02/22/2021 PCP: Waldon Merl, PA-C Referred by: Waldon Merl, PA-C  Subjective: Chief Complaint  Patient presents with   Lower Back - Pain   Right Thigh - Pain   HPI:  Parker Carey is a 36 y.o. male who comes in today for planned repeat right S1-2  Lumbar Transforaminal epidural steroid injection with fluoroscopic guidance.  The patient has failed conservative care including home exercise, medications, time and activity modification.  This injection will be diagnostic and hopefully therapeutic.  Please see requesting physician notes for further details and justification. Patient received more than 50% pain relief from prior injection.   Referring: Dr. Casimiro Needle Hilts   ROS Otherwise per HPI.  Assessment & Plan: Visit Diagnoses:    ICD-10-CM   1. Lumbar radiculopathy  M54.16 XR C-ARM NO REPORT    Epidural Steroid injection    methylPREDNISolone acetate (DEPO-MEDROL) injection 80 mg      Plan: No additional findings.   Meds & Orders:  Meds ordered this encounter  Medications   methylPREDNISolone acetate (DEPO-MEDROL) injection 80 mg    Orders Placed This Encounter  Procedures   XR C-ARM NO REPORT   Epidural Steroid injection    Follow-up: Return if symptoms worsen or fail to improve.   Procedures: No procedures performed      Clinical History: MRI LUMBAR SPINE WITHOUT CONTRAST   TECHNIQUE: Multiplanar, multisequence MR imaging of the lumbar spine was performed. No intravenous contrast was administered.   COMPARISON:  Lumbar radiographs 04/21/2018   FINDINGS: Segmentation:  Normal   Alignment:  Normal   Vertebrae:  Normal bone marrow.  Negative for fracture or mass.   Conus medullaris and cauda equina: Conus extends to the L1 level. Conus and cauda equina appear normal.   Paraspinal and other soft tissues: Negative for paraspinous mass  or edema   Disc levels:   L1-2: Negative   L2-3: Negative   L3-4: Negative   L4-5: Mild disc and mild facet degeneration. Negative for disc protrusion or stenosis   L5-S1: Shallow central disc protrusion. Negative for stenosis or neural impingement.   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 Negative for neural impingement or spinal stenosis.     Electronically Signed   By: Marlan Palau M.D.   On: 06/30/2018 12:50     Objective:  VS:  HT:     WT:    BMI:      BP:127/80   HR:90bpm   TEMP: ( )   RESP:  Physical Exam Vitals and nursing note reviewed.  Constitutional:      General: He is not in acute distress.    Appearance: Normal appearance. He is not ill-appearing.  HENT:     Head: Normocephalic and atraumatic.     Right Ear: External ear normal.     Left Ear: External ear normal.     Nose: No congestion.  Eyes:     Extraocular Movements: Extraocular movements intact.  Cardiovascular:     Rate and Rhythm: Normal rate.     Pulses: Normal pulses.  Pulmonary:     Effort: Pulmonary effort is normal. No respiratory distress.  Abdominal:     General: There is no distension.     Palpations: Abdomen is soft.  Musculoskeletal:        General: No tenderness or signs of injury.  Cervical back: Neck supple.     Right lower leg: No edema.     Left lower leg: No edema.     Comments: Patient has good distal strength without clonus.  Skin:    Findings: No erythema or rash.  Neurological:     General: No focal deficit present.     Mental Status: He is alert and oriented to person, place, and time.     Sensory: No sensory deficit.     Motor: No weakness or abnormal muscle tone.     Coordination: Coordination normal.  Psychiatric:        Mood and Affect: Mood normal.        Behavior: Behavior normal.     Imaging: No results found.

## 2021-02-22 NOTE — Patient Instructions (Signed)

## 2021-08-22 ENCOUNTER — Ambulatory Visit (HOSPITAL_COMMUNITY)
Admission: RE | Admit: 2021-08-22 | Discharge: 2021-08-22 | Disposition: A | Discharge status: Home / Self Care | Payer: 59 | Source: Ambulatory Visit | Attending: Urology | Admitting: Urology

## 2021-08-22 ENCOUNTER — Encounter: Payer: Self-pay | Admitting: Urology

## 2021-08-22 ENCOUNTER — Ambulatory Visit (INDEPENDENT_AMBULATORY_CARE_PROVIDER_SITE_OTHER): Payer: 59 | Admitting: Urology

## 2021-08-22 VITALS — BP 125/64 | HR 86 | Ht 68.0 in | Wt 165.0 lb

## 2021-08-22 DIAGNOSIS — R109 Unspecified abdominal pain: Secondary | ICD-10-CM | POA: Diagnosis not present

## 2021-08-22 DIAGNOSIS — N200 Calculus of kidney: Secondary | ICD-10-CM | POA: Diagnosis present

## 2021-08-22 DIAGNOSIS — R310 Gross hematuria: Secondary | ICD-10-CM

## 2021-08-22 LAB — URINALYSIS, ROUTINE W REFLEX MICROSCOPIC
Bilirubin, UA: NEGATIVE
Glucose, UA: NEGATIVE
Leukocytes,UA: NEGATIVE
Nitrite, UA: NEGATIVE
Protein,UA: NEGATIVE
Specific Gravity, UA: 1.015 (ref 1.005–1.030)
Urobilinogen, Ur: 0.2 mg/dL (ref 0.2–1.0)
pH, UA: 5.5 (ref 5.0–7.5)

## 2021-08-22 LAB — MICROSCOPIC EXAMINATION
Bacteria, UA: NONE SEEN
RBC, Urine: >30 /hpf — AB (ref 0–2)
Renal Epithel, UA: NONE SEEN /hpf
WBC, UA: NONE SEEN /hpf (ref 0–5)

## 2021-08-22 MED ORDER — HYDROCODONE-ACETAMINOPHEN 5-325 MG PO TABS: 1.0000 | ORAL_TABLET | Freq: Four times a day (QID) | ORAL | 0 refills | Status: DC | PRN

## 2021-08-22 NOTE — Progress Notes (Addendum)
Assessment: 1. Nephrolithiasis   2. Left flank pain   3. Gross hematuria     Plan: Recommend further evaluation with a CT renal stone study. Prescription for pain medication provided. Strain urine. We will contact him with results and recommendations.  Addendum: CT from today shows a 9 mm calculus in the proximal left ureter with associated obstruction.  The stone is easily visible on the scout film. Per our prior discussion, the patient was interested in proceeding with shockwave lithotripsy for management of his ureteral calculus.  The procedure including risk was discussed with the patient in detail.  He understands wishes to proceed as described.   The patient will be scheduled for Left ESL at [facility].  Surgical request is placed with the surgery schedulers and will be scheduled at the patient's/family request. Informed consent is given as documented below. Anesthesia:  local  The patient does not have sleep apnea, history of MRSA, history of VRE, history of cardiac device requiring special anesthetic needs. Patient is stable and considered clear for surgical in an outpatient ambulatory surgery setting as well as patient hospital setting.  Consent for Operation or Procedure: Provider Certification I hereby certify that the nature, purpose, benefits, usual and most frequent risks of, and alternatives to, the operation or procedure have been explained to the patient (or person authorized to sign for the patient) either by me as responsible physician or by the provider who is to perform the operation or procedure. Time spent such that the patient/family has had an opportunity to ask questions, and that those questions have been answered. The patient or the patient's representative has been advised that selected tasks may be performed by assistants to the primary health care provider(s). I believe that the patient (or person authorized to sign for the patient) understands what has  been explained, and has consented to the operation or procedure. No guarantees were implied or made.   Chief Complaint:  Chief Complaint  Patient presents with   Flank Pain   Nephrolithiasis    History of Present Illness:  Parker Carey is a 36 y.o. year old male who is seen for evaluation of nephrolithiasis and flank pain.  He has a prior history of nephrolithiasis.  He has required shockwave lithotripsy on at least 2 occasions.  His last episode was in 2020.  He reports passing 2 small stones since that time.  He presents today with a 1 month history of intermittent left flank pain and intermittent gross hematuria.  No fevers or chills.  He has had some urinary frequency.  No dysuria.  No recent imaging.  Past Medical History:  Past Medical History:  Diagnosis Date   Back pain    L-5- S-1 back pain   History of chickenpox    History of kidney stones    Kidney stones    SVT (supraventricular tachycardia) (HCC)     Past Surgical History:  Past Surgical History:  Procedure Laterality Date   EXTRACORPOREAL SHOCK WAVE LITHOTRIPSY Left 07/04/2016   Procedure: LEFT EXTRACORPOREAL SHOCK WAVE LITHOTRIPSY (ESWL);  Surgeon: Crist Fat, MD;  Location: [facility];  Service: Urology;  Laterality: Left;   EXTRACORPOREAL SHOCK WAVE LITHOTRIPSY Left 04/02/2018   Procedure: EXTRACORPOREAL SHOCK WAVE LITHOTRIPSY (ESWL);  Surgeon: Sebastian Ache, MD;  Location: [facility];  Service: Urology;  Laterality: Left;   FRACTURE SURGERY     ORIF TIBIA & FIBULA FRACTURES Right 2005   SUPRAVENTRICULAR TACHYCARDIA ABLATION  05/20/2014   SUPRAVENTRICULAR TACHYCARDIA  ABLATION N/A 05/20/2014   Procedure: SUPRAVENTRICULAR TACHYCARDIA ABLATION;  Surgeon: Hillis Range, MD;  Location: [facility] CATH LAB;  Service: Cardiovascular;  Laterality: N/A;    Allergies:  No Known Allergies  Family History:  Family History  Problem Relation Age of Onset   Healthy Mother    Healthy Father    Cancer Maternal Grandmother     Hyperlipidemia Maternal Grandmother     Social History:  Social History   Tobacco Use   Smoking status: Former    Years: 9.00    Types: Cigarettes   Smokeless tobacco: Former    Types: Associate Professor Use: Never used  Substance Use Topics   Alcohol use: Yes    Alcohol/week: 6.0 standard drinks of alcohol    Types: 6 Cans of beer per week    Comment: occas   Drug use: No    Review of symptoms:  Constitutional:  Negative for unexplained weight loss, night sweats, fever, chills ENT:  Negative for nose bleeds, sinus pain, painful swallowing CV:  Negative for chest pain, shortness of breath, exercise intolerance, palpitations, loss of consciousness Resp:  Negative for cough, wheezing, shortness of breath GI:  Negative for nausea, vomiting, diarrhea, bloody stools GU:  Positives noted in HPI; otherwise negative for dysuria, urinary incontinence Neuro:  Negative for seizures, poor balance, limb weakness, slurred speech Psych:  Negative for lack of energy, depression, anxiety Endocrine:  Negative for polydipsia, polyuria, symptoms of hypoglycemia (dizziness, hunger, sweating) Hematologic:  Negative for anemia, purpura, petechia, prolonged or excessive bleeding, use of anticoagulants  Allergic:  Negative for difficulty breathing or choking as a result of exposure to anything; no shellfish allergy; no allergic response (rash/itch) to materials, foods  Physical exam: BP 125/64   Pulse 86   Ht 5\' 8"  (1.727 m)   Wt 165 lb (74.8 kg)   BMI 25.09 kg/m  GENERAL APPEARANCE:  Well appearing, well developed, well nourished, NAD HEENT: Atraumatic, Normocephalic, oropharynx clear. NECK: Supple without lymphadenopathy or thyromegaly. LUNGS: Clear to auscultation bilaterally. HEART: Regular Rate and Rhythm without murmurs, gallops, or rubs. ABDOMEN: Soft, non-tender, No Masses. EXTREMITIES: Moves all extremities well.  Without clubbing, cyanosis, or edema. NEUROLOGIC:  Alert and  oriented x 3, normal gait, CN II-XII grossly intact.  MENTAL STATUS:  Appropriate. BACK:  Non-tender to palpation.  No CVAT SKIN:  Warm, dry and intact.    Results: U/A: >30 RBCs

## 2021-08-22 NOTE — H&P (View-Only) (Signed)
Assessment: 1. Nephrolithiasis   2. Left flank pain   3. Gross hematuria     Plan: Recommend further evaluation with a CT renal stone study. Prescription for pain medication provided. Strain urine. We will contact him with results and recommendations.  Addendum: CT from today shows a 9 mm calculus in the proximal left ureter with associated obstruction.  The stone is easily visible on the scout film. Per our prior discussion, the patient was interested in proceeding with shockwave lithotripsy for management of his ureteral calculus.  The procedure including risk was discussed with the patient in detail.  He understands wishes to proceed as described.   The patient will be scheduled for Left ESL at [facility].  Surgical request is placed with the surgery schedulers and will be scheduled at the patient's/family request. Informed consent is given as documented below. Anesthesia:  local  The patient does not have sleep apnea, history of MRSA, history of VRE, history of cardiac device requiring special anesthetic needs. Patient is stable and considered clear for surgical in an outpatient ambulatory surgery setting as well as patient hospital setting.  Consent for Operation or Procedure: Provider Certification I hereby certify that the nature, purpose, benefits, usual and most frequent risks of, and alternatives to, the operation or procedure have been explained to the patient (or person authorized to sign for the patient) either by me as responsible physician or by the provider who is to perform the operation or procedure. Time spent such that the patient/family has had an opportunity to ask questions, and that those questions have been answered. The patient or the patient's representative has been advised that selected tasks may be performed by assistants to the primary health care provider(s). I believe that the patient (or person authorized to sign for the patient) understands what has  been explained, and has consented to the operation or procedure. No guarantees were implied or made.   Chief Complaint:  Chief Complaint  Patient presents with   Flank Pain   Nephrolithiasis    History of Present Illness:  Parker Carey is a 36 y.o. year old male who is seen for evaluation of nephrolithiasis and flank pain.  He has a prior history of nephrolithiasis.  He has required shockwave lithotripsy on at least 2 occasions.  His last episode was in 2020.  He reports passing 2 small stones since that time.  He presents today with a 1 month history of intermittent left flank pain and intermittent gross hematuria.  No fevers or chills.  He has had some urinary frequency.  No dysuria.  No recent imaging.  Past Medical History:  Past Medical History:  Diagnosis Date   Back pain    L-5- S-1 back pain   History of chickenpox    History of kidney stones    Kidney stones    SVT (supraventricular tachycardia) (HCC)     Past Surgical History:  Past Surgical History:  Procedure Laterality Date   EXTRACORPOREAL SHOCK WAVE LITHOTRIPSY Left 07/04/2016   Procedure: LEFT EXTRACORPOREAL SHOCK WAVE LITHOTRIPSY (ESWL);  Surgeon: Crist Fat, MD;  Location: [facility];  Service: Urology;  Laterality: Left;   EXTRACORPOREAL SHOCK WAVE LITHOTRIPSY Left 04/02/2018   Procedure: EXTRACORPOREAL SHOCK WAVE LITHOTRIPSY (ESWL);  Surgeon: Sebastian Ache, MD;  Location: [facility];  Service: Urology;  Laterality: Left;   FRACTURE SURGERY     ORIF TIBIA & FIBULA FRACTURES Right 2005   SUPRAVENTRICULAR TACHYCARDIA ABLATION  05/20/2014   SUPRAVENTRICULAR TACHYCARDIA  ABLATION N/A 05/20/2014   Procedure: SUPRAVENTRICULAR TACHYCARDIA ABLATION;  Surgeon: James Allred, MD;  Location: [facility];  Service: Cardiovascular;  Laterality: N/A;    Allergies:  No Known Allergies  Family History:  Family History  Problem Relation Age of Onset   Healthy Mother    Healthy Father    Cancer Maternal Grandmother     Hyperlipidemia Maternal Grandmother     Social History:  Social History   Tobacco Use   Smoking status: Former    Years: 9.00    Types: Cigarettes   Smokeless tobacco: Former    Types: Chew  Vaping Use   Vaping Use: Never used  Substance Use Topics   Alcohol use: Yes    Alcohol/week: 6.0 standard drinks of alcohol    Types: 6 Cans of beer per week    Comment: occas   Drug use: No    Review of symptoms:  Constitutional:  Negative for unexplained weight loss, night sweats, fever, chills ENT:  Negative for nose bleeds, sinus pain, painful swallowing CV:  Negative for chest pain, shortness of breath, exercise intolerance, palpitations, loss of consciousness Resp:  Negative for cough, wheezing, shortness of breath GI:  Negative for nausea, vomiting, diarrhea, bloody stools GU:  Positives noted in HPI; otherwise negative for dysuria, urinary incontinence Neuro:  Negative for seizures, poor balance, limb weakness, slurred speech Psych:  Negative for lack of energy, depression, anxiety Endocrine:  Negative for polydipsia, polyuria, symptoms of hypoglycemia (dizziness, hunger, sweating) Hematologic:  Negative for anemia, purpura, petechia, prolonged or excessive bleeding, use of anticoagulants  Allergic:  Negative for difficulty breathing or choking as a result of exposure to anything; no shellfish allergy; no allergic response (rash/itch) to materials, foods  Physical exam: BP 125/64   Pulse 86   Ht 5' 8" (1.727 m)   Wt 165 lb (74.8 kg)   BMI 25.09 kg/m  GENERAL APPEARANCE:  Well appearing, well developed, well nourished, NAD HEENT: Atraumatic, Normocephalic, oropharynx clear. NECK: Supple without lymphadenopathy or thyromegaly. LUNGS: Clear to auscultation bilaterally. HEART: Regular Rate and Rhythm without murmurs, gallops, or rubs. ABDOMEN: Soft, non-tender, No Masses. EXTREMITIES: Moves all extremities well.  Without clubbing, cyanosis, or edema. NEUROLOGIC:  Alert and  oriented x 3, normal gait, CN II-XII grossly intact.  MENTAL STATUS:  Appropriate. BACK:  Non-tender to palpation.  No CVAT SKIN:  Warm, dry and intact.    Results: U/A: >30 RBCs  

## 2021-08-23 ENCOUNTER — Other Ambulatory Visit: Payer: Self-pay

## 2021-08-23 DIAGNOSIS — N201 Calculus of ureter: Secondary | ICD-10-CM | POA: Insufficient documentation

## 2021-08-23 DIAGNOSIS — N200 Calculus of kidney: Secondary | ICD-10-CM

## 2021-08-27 ENCOUNTER — Telehealth: Payer: Self-pay

## 2021-08-27 ENCOUNTER — Encounter (HOSPITAL_COMMUNITY): Payer: Self-pay

## 2021-08-27 ENCOUNTER — Encounter (HOSPITAL_COMMUNITY)
Admission: RE | Admit: 2021-08-27 | Discharge: 2021-08-27 | Disposition: A | Discharge status: Home / Self Care | Payer: 59 | Source: Ambulatory Visit | Attending: Urology | Admitting: Urology

## 2021-08-27 NOTE — Telephone Encounter (Signed)
Returned call to patient- cell service was poor and unable to hear patient.

## 2021-08-27 NOTE — Telephone Encounter (Signed)
Patient called stating that the nurse did not tell him his time for the [facility] tomorrow, where is it going to be and if he can pick up the packet anytime today.  Please advise.

## 2021-08-27 NOTE — Telephone Encounter (Signed)
Returned call and discussed litho procedure for 07/18

## 2021-08-28 ENCOUNTER — Ambulatory Visit (HOSPITAL_COMMUNITY)
Admission: RE | Admit: 2021-08-28 | Discharge: 2021-08-28 | Disposition: A | Discharge status: Home / Self Care | Payer: 59 | Attending: Urology | Admitting: Urology

## 2021-08-28 ENCOUNTER — Other Ambulatory Visit: Payer: Self-pay | Admitting: Urology

## 2021-08-28 ENCOUNTER — Ambulatory Visit (HOSPITAL_COMMUNITY): Payer: 59

## 2021-08-28 ENCOUNTER — Encounter (HOSPITAL_COMMUNITY)
Admission: RE | Disposition: A | Discharge status: Home / Self Care | Payer: Self-pay | Source: Home / Self Care | Attending: Urology

## 2021-08-28 ENCOUNTER — Encounter (HOSPITAL_COMMUNITY): Payer: Self-pay | Admitting: Urology

## 2021-08-28 DIAGNOSIS — R10A2 Flank pain, left side: Secondary | ICD-10-CM

## 2021-08-28 DIAGNOSIS — R310 Gross hematuria: Secondary | ICD-10-CM | POA: Diagnosis not present

## 2021-08-28 DIAGNOSIS — N201 Calculus of ureter: Secondary | ICD-10-CM | POA: Insufficient documentation

## 2021-08-28 DIAGNOSIS — R109 Unspecified abdominal pain: Secondary | ICD-10-CM | POA: Insufficient documentation

## 2021-08-28 HISTORY — PX: EXTRACORPOREAL SHOCK WAVE LITHOTRIPSY: SHX1557

## 2021-08-28 SURGERY — LITHOTRIPSY, ESWL
Anesthesia: LOCAL | Laterality: Left

## 2021-08-28 MED ORDER — CIPROFLOXACIN HCL 250 MG PO TABS
500.0000 mg | ORAL_TABLET | Freq: Once | ORAL | Status: AC
  Administered 2021-08-28: 500 mg via ORAL
  Filled 2021-08-28: qty 1

## 2021-08-28 MED ORDER — SODIUM CHLORIDE 0.9 % IV SOLN: INTRAVENOUS | Status: DC

## 2021-08-28 MED ORDER — DIPHENHYDRAMINE HCL 25 MG PO CAPS
25.0000 mg | ORAL_CAPSULE | ORAL | Status: AC
  Administered 2021-08-28: 25 mg via ORAL
  Filled 2021-08-28: qty 1

## 2021-08-28 MED ORDER — DIAZEPAM 5 MG PO TABS
10.0000 mg | ORAL_TABLET | Freq: Once | ORAL | Status: AC
  Administered 2021-08-28: 10 mg via ORAL
  Filled 2021-08-28: qty 2

## 2021-08-28 MED ORDER — HYDROCODONE-ACETAMINOPHEN 5-325 MG PO TABS
1.0000 | ORAL_TABLET | Freq: Four times a day (QID) | ORAL | 0 refills | Status: DC | PRN
Start: 2021-08-28 — End: 2023-06-06

## 2021-08-28 MED ORDER — TAMSULOSIN HCL 0.4 MG PO CAPS: 0.4000 mg | ORAL_CAPSULE | Freq: Every day | ORAL | 1 refills | Status: DC

## 2021-08-28 NOTE — Interval H&P Note (Signed)
History and Physical Interval Note:  08/28/2021 9:57 AM  Parker Carey  has presented today for surgery, with the diagnosis of left ureteral calculus.  The various methods of treatment have been discussed with the patient and family. After consideration of risks, benefits and other options for treatment, the patient has consented to  Procedure(s): EXTRACORPOREAL SHOCK WAVE LITHOTRIPSY (ESWL) (Left) as a surgical intervention.  The patient's history has been reviewed, patient examined, no change in status, stable for surgery.  I have reviewed the patient's chart and labs.  Questions were answered to the patient's satisfaction.     Di Kindle

## 2021-08-29 ENCOUNTER — Encounter (HOSPITAL_COMMUNITY): Payer: Self-pay | Admitting: Urology

## 2021-09-10 ENCOUNTER — Telehealth: Payer: Self-pay

## 2021-09-10 NOTE — Telephone Encounter (Signed)
Pt wife here today stating her husband is requesting repeat of Rt S1 TF he had on 02/22/21. Pt mention it helped a lot and he has same pain and side. Please advise.

## 2021-09-11 ENCOUNTER — Ambulatory Visit: Payer: 59 | Admitting: Physician Assistant

## 2021-09-13 ENCOUNTER — Ambulatory Visit: Payer: 59 | Admitting: Physician Assistant

## 2021-09-20 ENCOUNTER — Other Ambulatory Visit: Payer: Self-pay

## 2021-09-20 MED ORDER — TAMSULOSIN HCL 0.4 MG PO CAPS: 0.4000 mg | ORAL_CAPSULE | Freq: Every day | ORAL | 1 refills | Status: AC

## 2021-10-01 ENCOUNTER — Ambulatory Visit: Payer: 59 | Admitting: Physician Assistant

## 2021-10-01 ENCOUNTER — Encounter: Payer: Self-pay | Admitting: Physical Medicine and Rehabilitation

## 2021-10-01 ENCOUNTER — Ambulatory Visit: Payer: Self-pay

## 2021-10-01 ENCOUNTER — Ambulatory Visit (HOSPITAL_COMMUNITY)
Admission: RE | Admit: 2021-10-01 | Discharge: 2021-10-01 | Disposition: A | Discharge status: Home / Self Care | Payer: 59 | Source: Ambulatory Visit | Attending: Physician Assistant | Admitting: Physician Assistant

## 2021-10-01 ENCOUNTER — Ambulatory Visit (INDEPENDENT_AMBULATORY_CARE_PROVIDER_SITE_OTHER): Payer: 59 | Admitting: Physical Medicine and Rehabilitation

## 2021-10-01 VITALS — BP 153/87 | HR 102

## 2021-10-01 DIAGNOSIS — N200 Calculus of kidney: Secondary | ICD-10-CM | POA: Diagnosis present

## 2021-10-01 DIAGNOSIS — M5416 Radiculopathy, lumbar region: Secondary | ICD-10-CM

## 2021-10-01 MED ORDER — METHYLPREDNISOLONE ACETATE 80 MG/ML IJ SUSP
40.0000 mg | Freq: Once | INTRAMUSCULAR | Status: AC
  Administered 2021-10-01: 40 mg

## 2021-10-01 NOTE — Patient Instructions (Signed)

## 2021-10-01 NOTE — Progress Notes (Signed)
Pt state lower back pain that travels to his right buttock  down his right stops at mid thigh. Pt state walking, standing and bending makes the pain worse. Pt state he takes over the counter pain meds to help ease his pain.  Numeric Pain Rating Scale and Functional Assessment Average Pain 1   In the last MONTH (on 0-10 scale) has pain interfered with the following?  1. General activity like being  able to carry out your everyday physical activities such as walking, climbing stairs, carrying groceries, or moving a chair?  Rating(5)   +Driver, -BT, -Dye Allergies.

## 2021-10-11 NOTE — Procedures (Signed)
S1 Lumbosacral Transforaminal Epidural Steroid Injection - Sub-Pedicular Approach with Fluoroscopic Guidance   Patient: Parker Carey      Date of Birth: 1985-08-02 MRN: [id number] PCP: Pcp, No      Visit Date: 10/01/2021   Universal Protocol:    Date/Time: 08/31/235:43 AM  Consent Given By: the patient  Position:  PRONE  Additional Comments: Vital signs were monitored before and after the procedure. Patient was prepped and draped in the usual sterile fashion. The correct patient, procedure, and site was verified.   Injection Procedure Details:  Procedure Site One Meds Administered:  Meds ordered this encounter  Medications   methylPREDNISolone acetate (DEPO-MEDROL) injection 40 mg    Laterality: Right  Location/Site:  S1 Foramen   Needle size: 22 ga.  Needle type: Spinal  Needle Placement: Transforaminal  Findings:   -Comments: Excellent flow of contrast along the nerve, nerve root and into the epidural space.  Epidurogram: Contrast epidurogram showed no nerve root cut off or restricted flow pattern.  Procedure Details: After squaring off the sacral end-plate to get a true AP view, the C-arm was positioned so that the best possible view of the S1 foramen was visualized. The soft tissues overlying this structure were infiltrated with 2-3 ml. of 1% Lidocaine without Epinephrine.    The spinal needle was inserted toward the target using a "trajectory" view along the fluoroscope beam.  Under AP and lateral visualization, the needle was advanced so it did not puncture dura. Biplanar projections were used to confirm position. Aspiration was confirmed to be negative for CSF and/or blood. A 1-2 ml. volume of Isovue-250 was injected and flow of contrast was noted at each level. Radiographs were obtained for documentation purposes.   After attaining the desired flow of contrast documented above, a 0.5 to 1.0 ml test dose of 0.25% Marcaine was injected into each respective  transforaminal space.  The patient was observed for 90 seconds post injection.  After no sensory deficits were reported, and normal lower extremity motor function was noted,   the above injectate was administered so that equal amounts of the injectate were placed at each foramen (level) into the transforaminal epidural space.   Additional Comments:  The patient tolerated the procedure well Dressing: Band-Aid with 2 x 2 sterile gauze    Post-procedure details: Patient was observed during the procedure. Post-procedure instructions were reviewed.  Patient left the clinic in stable condition.

## 2021-10-11 NOTE — Progress Notes (Signed)
Parker Carey - 36 y.o. male MRN [id number]  Date of birth: [birth year]  Office Visit Note: Visit Date: 10/01/2021 PCP: Pcp, No Referred by: Parker Antonio, MD  Subjective: Chief Complaint  Patient presents with   Lower Back - Pain   Right Leg - Pain   Right Thigh - Pain   HPI:  Parker Carey is a 36 y.o. male who comes in today for planned repeat Right S1-2  Lumbar Transforaminal epidural steroid injection with fluoroscopic guidance.  The patient has failed conservative care including home exercise, medications, time and activity modification.  This injection will be diagnostic and hopefully therapeutic.  Please see requesting physician notes for further details and justification. Patient received more than 50% pain relief from prior injection.   Referring: Dr. Casimiro Needle Hilts   ROS Otherwise per HPI.  Assessment & Plan: Visit Diagnoses:    ICD-10-CM   1. Lumbar radiculopathy  M54.16 XR C-ARM NO REPORT    Epidural Steroid injection    methylPREDNISolone acetate (DEPO-MEDROL) injection 40 mg      Plan: No additional findings.   Meds & Orders:  Meds ordered this encounter  Medications   methylPREDNISolone acetate (DEPO-MEDROL) injection 40 mg    Orders Placed This Encounter  Procedures   XR C-ARM NO REPORT   Epidural Steroid injection    Follow-up: Return for visit to requesting provider as needed.   Procedures: No procedures performed  S1 Lumbosacral Transforaminal Epidural Steroid Injection - Sub-Pedicular Approach with Fluoroscopic Guidance   Patient: Parker Carey      Date of Birth: 1985-02-16 MRN: [id number] PCP: Pcp, No      Visit Date: 10/01/2021   Universal Protocol:    Date/Time: 08/31/235:43 AM  Consent Given By: the patient  Position:  PRONE  Additional Comments: Vital signs were monitored before and after the procedure. Patient was prepped and draped in the usual sterile fashion. The correct patient, procedure, and site was  verified.   Injection Procedure Details:  Procedure Site One Meds Administered:  Meds ordered this encounter  Medications   methylPREDNISolone acetate (DEPO-MEDROL) injection 40 mg    Laterality: Right  Location/Site:  S1 Foramen   Needle size: 22 ga.  Needle type: Spinal  Needle Placement: Transforaminal  Findings:   -Comments: Excellent flow of contrast along the nerve, nerve root and into the epidural space.  Epidurogram: Contrast epidurogram showed no nerve root cut off or restricted flow pattern.  Procedure Details: After squaring off the sacral end-plate to get a true AP view, the C-arm was positioned so that the best possible view of the S1 foramen was visualized. The soft tissues overlying this structure were infiltrated with 2-3 ml. of 1% Lidocaine without Epinephrine.    The spinal needle was inserted toward the target using a "trajectory" view along the fluoroscope beam.  Under AP and lateral visualization, the needle was advanced so it did not puncture dura. Biplanar projections were used to confirm position. Aspiration was confirmed to be negative for CSF and/or blood. A 1-2 ml. volume of Isovue-250 was injected and flow of contrast was noted at each level. Radiographs were obtained for documentation purposes.   After attaining the desired flow of contrast documented above, a 0.5 to 1.0 ml test dose of 0.25% Marcaine was injected into each respective transforaminal space.  The patient was observed for 90 seconds post injection.  After no sensory deficits were reported, and normal lower extremity motor function was noted,   the above  injectate was administered so that equal amounts of the injectate were placed at each foramen (level) into the transforaminal epidural space.   Additional Comments:  The patient tolerated the procedure well Dressing: Band-Aid with 2 x 2 sterile gauze    Post-procedure details: Patient was observed during the  procedure. Post-procedure instructions were reviewed.  Patient left the clinic in stable condition.   Clinical History: No specialty comments available.     Objective:  VS:  HT:    WT:   BMI:     BP:(!) 153/87  HR:(!) 102bpm  TEMP: ( )  RESP:  Physical Exam   Imaging: No results found.

## 2021-10-18 ENCOUNTER — Other Ambulatory Visit: Payer: Self-pay

## 2021-10-18 NOTE — Progress Notes (Signed)
Pharmacy sent refill request for Tamsulosin.  Confirmed with the patient the he does not need a refill at this time.

## 2021-12-02 NOTE — ED Provider Notes (Signed)
 [facility]  Name: Parker Carey DOB / AGE: [birth year] / 36 y.o. MRN: [id number]  Date of Admission: 12/02/2021   HPI History of Present Illness  Parker Carey is a 36 y.o. male no significant history presents with right eye pain.  He was throwing away a cardboard box last night when the corner of a box spring and back and hit him in the right eye.  Since that he has had a hard time opening his right eye.  Felt like the corner of the box went under his eyelid.     History obtained from source other than patient: no  Pertinent positive and negative ROS elements included in HPI as above.   Patient History Patient History No past medical history on file. No past surgical history on file. No family history on file. Social History   Socioeconomic History  . Marital status: Married    Spouse name: Not on file  . Number of children: Not on file  . Years of education: Not on file  . Highest education level: Not on file  Occupational History  . Not on file  Tobacco Use  . Smoking status: Some Days    Types: Cigarettes  . Smokeless tobacco: Current    Types: Chew  Vaping Use  . Vaping Use: Never used  Substance and Sexual Activity  . Alcohol use: Yes  . Drug use: Never  . Sexual activity: Not on file  Other Topics Concern  . Not on file  Social History Narrative  . Not on file   Social Determinants of Health   Financial Resource Strain: Not on file  Food Insecurity: Not on file  Transportation Needs: Not on file  Physical Activity: Not on file  Stress: Not on file  Social Connections: Not on file  Intimate Partner Violence: Not on file  Housing Stability: Not on file         Physical Exam  Physical Exam Vitals and nursing note reviewed.  Constitutional:      Appearance: Normal appearance.  HENT:     Head: Normocephalic and atraumatic.     Nose: Nose normal.     Mouth/Throat:     Mouth: Mucous  membranes are moist.     Pharynx: Oropharynx is clear.  Eyes:     General: Lids are normal. Lids are everted, no foreign bodies appreciated.        Right eye: No foreign body or discharge.        Left eye: No foreign body or discharge.     Extraocular Movements:     Right eye: Normal extraocular motion.     Left eye: Normal extraocular motion.     Conjunctiva/sclera:     Right eye: Right conjunctiva is injected. No exudate or hemorrhage.    Left eye: Left conjunctiva is not injected. No exudate or hemorrhage.    Pupils: Pupils are equal, round, and reactive to light.     Right eye: Corneal abrasion and fluorescein uptake present. Seidel exam negative.     Left eye: No corneal abrasion or fluorescein uptake.      Comments: Acuity 20/20 b/l eyes  Cardiovascular:     Rate and Rhythm: Normal rate and regular rhythm.  Pulmonary:     Effort: Pulmonary effort is normal. No respiratory distress.     Breath sounds: Normal breath sounds.  Abdominal:     General: Abdomen is flat.     Palpations:  Abdomen is soft.     Tenderness: There is no abdominal tenderness.  Musculoskeletal:        General: No deformity. Normal range of motion.     Cervical back: Normal range of motion. No rigidity.  Skin:    General: Skin is warm and dry.  Neurological:     General: No focal deficit present.     Mental Status: He is alert and oriented to person, place, and time.       ED vitals reviewed ED nursing notes reviewed  ED Triage Vitals [12/02/21 0719]  Temp Heart Rate Resp BP SpO2  97.3 F (36.3 C) 83 16 (!) 145/95 99 %    Temp Source Heart Rate Source Patient Position MAP (mmHg) O2 Flow Rate (L/min)  Temporal -- -- -- --    BP Location Weight FiO2 (%) O2 Device   -- 82.9 kg (182 lb 12.2 oz) -- None (Room air)    Vitals:   12/02/21 0719  BP: (!) 145/95  Pulse: 83  Resp: 16  Temp: 97.3 F (36.3 C)  TempSrc: Temporal  SpO2: 99%  Weight: 82.9 kg (182 lb 12.2 oz)             ED  Course & MDM Emergency Department Course  Clinical Impressions as of 12/02/21 0810  Right corneal abrasion, initial encounter      Medical Decision Making Medical Decision Making Problems Addressed: Right corneal abrasion, initial encounter: complicated acute illness or injury  Risk Prescription drug management.     Differential diagnosis: corneal abrasion confirmed., no open globe, no signs of retro-orbital hematoma  Chronic conditions affecting care: no  Medical records reviewed: no  Independent test interpretation: no   Discussion with other providers: no   Workup/Meds/admit considered (but not ordered): no   Care affected by Social determinants of health: no   Plan of care: Patient on exam has a small corneal abrasion in the right eye.  Prescribe erythromycin ointment for 1 week.  Had significant pain relief with tetracaine here.  Provided the tetracaine bottle to take home and use over the next 24 hours every 3 hours as needed.  Patient to discard the bottle after 24 hours to reduce risk of developing corneal ulcer.  Patient understands and agrees to this plan.  Also recommend follow-up with an ophthalmologist.       Clinical Decision Scores:         Glasgow Coma Scale Score: 15                  Labs Reviewed - No data to display No orders to display   No results found for this or any previous visit (from the past 4464 hour(s)).  Medications  fluorescein ophthalmic strip 1 strip (1 strip Both Eyes Given 12/02/21 0740)  tetracaine HCl (PF) (ALTACAINE) 0.5 % ophthalmic solution 1-2 drop (2 drops Both Eyes Given 12/02/21 0740)  erythromycin (ROMYCIN) 5 mg/gram (0.5 %) ophthalmic ointment 0.5 inch (0.5 inches Right Eye Given 12/02/21 0802)     Procedures  ED Disposition: Discharge  Final diagnoses:  [S05.01XA] Right corneal abrasion, initial encounter      Medication List     START taking these medications    erythromycin ophthalmic  ointment Commonly known as: ROMYCIN Apply 0.5 inches to right eye every 6 (six) hours for 7 days. Place a 1/2 inch ribbon of ointment into the lower eyelid.         Where to Get Your Medications  You can get these medications from any pharmacy   Bring a paper prescription for each of these medications erythromycin ophthalmic ointment      Education Documentation No documentation found. Education Comments No comments found.    This note has been generated by a word-recognition, computerized program. There may be typographic, grammatic or word substitution errors that have escaped my editorial review. Please disregard these errors.  Please note that my note may have been completed after the patient has left the emergency department. My note reflects the events that occurred during the patient's stay in the emergency department until the patient's subsequent departure from the emergency department either through admission/discharge or as noted otherwise.  Documents reviewed: Emergency department nurses' notes, emergency department records, Reviewed and interpreted previous ER visit documentation. Additional records outside of ER records reviewed will be listed in MDM above.

## 2022-06-10 ENCOUNTER — Telehealth: Payer: Self-pay | Admitting: Physical Medicine and Rehabilitation

## 2022-06-10 NOTE — Telephone Encounter (Signed)
Patient requesting a Cortisone injection in his back

## 2022-06-12 ENCOUNTER — Other Ambulatory Visit: Payer: Self-pay | Admitting: Physical Medicine and Rehabilitation

## 2022-06-12 DIAGNOSIS — M5416 Radiculopathy, lumbar region: Secondary | ICD-10-CM

## 2022-06-12 NOTE — Telephone Encounter (Signed)
Spoke with patient and he is requesting another injection with same type of pain. Last injection was 09/2021 and lasted up until last week and worked more than 75%. No new falls, accidents or injuries. Please advise

## 2022-06-25 ENCOUNTER — Ambulatory Visit (INDEPENDENT_AMBULATORY_CARE_PROVIDER_SITE_OTHER): Payer: 59 | Admitting: Physical Medicine and Rehabilitation

## 2022-06-25 ENCOUNTER — Other Ambulatory Visit: Payer: Self-pay

## 2022-06-25 DIAGNOSIS — M5116 Intervertebral disc disorders with radiculopathy, lumbar region: Secondary | ICD-10-CM | POA: Diagnosis not present

## 2022-06-25 DIAGNOSIS — M5416 Radiculopathy, lumbar region: Secondary | ICD-10-CM

## 2022-06-25 DIAGNOSIS — G894 Chronic pain syndrome: Secondary | ICD-10-CM | POA: Diagnosis not present

## 2022-06-25 MED ORDER — METHYLPREDNISOLONE ACETATE 80 MG/ML IJ SUSP
80.0000 mg | Freq: Once | INTRAMUSCULAR | Status: AC
  Administered 2022-06-25: 80 mg

## 2022-06-25 NOTE — Progress Notes (Signed)
Functional Pain Scale - descriptive words and definitions  Uncomfortable (3)  Pain is present but can complete all ADL's/sleep is slightly affected and passive distraction only gives marginal relief. Mild range order  Average Pain 3   +Driver, -BT, -Dye Allergies.  Lower back pain on right side

## 2022-06-25 NOTE — Patient Instructions (Signed)

## 2022-07-11 ENCOUNTER — Encounter: Payer: Self-pay | Admitting: Neurology

## 2022-07-11 ENCOUNTER — Emergency Department (HOSPITAL_COMMUNITY): Payer: 59

## 2022-07-11 ENCOUNTER — Other Ambulatory Visit: Payer: Self-pay

## 2022-07-11 ENCOUNTER — Encounter (HOSPITAL_COMMUNITY): Payer: Self-pay | Admitting: *Deleted

## 2022-07-11 ENCOUNTER — Emergency Department (HOSPITAL_COMMUNITY)
Admission: EM | Admit: 2022-07-11 | Discharge: 2022-07-11 | Disposition: A | Discharge status: Home / Self Care | Payer: 59 | Attending: Emergency Medicine | Admitting: Emergency Medicine

## 2022-07-11 DIAGNOSIS — R000 Tachycardia, unspecified: Secondary | ICD-10-CM | POA: Diagnosis not present

## 2022-07-11 DIAGNOSIS — R202 Paresthesia of skin: Secondary | ICD-10-CM

## 2022-07-11 DIAGNOSIS — E876 Hypokalemia: Secondary | ICD-10-CM | POA: Diagnosis not present

## 2022-07-11 DIAGNOSIS — F10930 Alcohol use, unspecified with withdrawal, uncomplicated: Secondary | ICD-10-CM | POA: Insufficient documentation

## 2022-07-11 DIAGNOSIS — R0602 Shortness of breath: Secondary | ICD-10-CM | POA: Insufficient documentation

## 2022-07-11 DIAGNOSIS — R002 Palpitations: Secondary | ICD-10-CM | POA: Diagnosis present

## 2022-07-11 DIAGNOSIS — Z79899 Other long term (current) drug therapy: Secondary | ICD-10-CM | POA: Diagnosis not present

## 2022-07-11 LAB — URINALYSIS, ROUTINE W REFLEX MICROSCOPIC
Bilirubin Urine: NEGATIVE
Glucose, UA: NEGATIVE mg/dL
Hgb urine dipstick: NEGATIVE
Ketones, ur: NEGATIVE mg/dL
Leukocytes,Ua: NEGATIVE
Nitrite: NEGATIVE
Protein, ur: NEGATIVE mg/dL
Specific Gravity, Urine: 1.012 (ref 1.005–1.030)
pH: 6 (ref 5.0–8.0)

## 2022-07-11 LAB — CBC WITH DIFFERENTIAL/PLATELET
Abs Immature Granulocytes: 0.06 10*3/uL (ref 0.00–0.07)
Basophils Absolute: 0.1 10*3/uL (ref 0.0–0.1)
Basophils Relative: 1 %
Eosinophils Absolute: 0.3 10*3/uL (ref 0.0–0.5)
Eosinophils Relative: 3 %
HCT: 48.2 % (ref 39.0–52.0)
Hemoglobin: 16.4 g/dL (ref 13.0–17.0)
Immature Granulocytes: 1 %
Lymphocytes Relative: 40 %
Lymphs Abs: 3.7 10*3/uL (ref 0.7–4.0)
MCH: 33.1 pg (ref 26.0–34.0)
MCHC: 34 g/dL (ref 30.0–36.0)
MCV: 97.2 fL (ref 80.0–100.0)
Monocytes Absolute: 0.7 10*3/uL (ref 0.1–1.0)
Monocytes Relative: 8 %
Neutro Abs: 4.5 10*3/uL (ref 1.7–7.7)
Neutrophils Relative %: 47 %
Platelets: 249 10*3/uL (ref 150–400)
RBC: 4.96 MIL/uL (ref 4.22–5.81)
RDW: 13 % (ref 11.5–15.5)
WBC: 9.3 10*3/uL (ref 4.0–10.5)
nRBC: 0 % (ref 0.0–0.2)

## 2022-07-11 LAB — COMPREHENSIVE METABOLIC PANEL
ALT: 25 U/L (ref 0–44)
AST: 18 U/L (ref 15–41)
Albumin: 3.9 g/dL (ref 3.5–5.0)
Alkaline Phosphatase: 66 U/L (ref 38–126)
Anion gap: 9 (ref 5–15)
BUN: 11 mg/dL (ref 6–20)
CO2: 23 mmol/L (ref 22–32)
Calcium: 7.6 mg/dL — ABNORMAL LOW (ref 8.9–10.3)
Chloride: 102 mmol/L (ref 98–111)
Creatinine, Ser: 0.74 mg/dL (ref 0.61–1.24)
GFR, Estimated: >60 mL/min (ref >60)
Glucose, Bld: 96 mg/dL (ref 70–99)
Potassium: 3.1 mmol/L — ABNORMAL LOW (ref 3.5–5.1)
Sodium: 134 mmol/L — ABNORMAL LOW (ref 135–145)
Total Bilirubin: 0.3 mg/dL (ref 0.3–1.2)
Total Protein: 6.6 g/dL (ref 6.5–8.1)

## 2022-07-11 LAB — RAPID URINE DRUG SCREEN, HOSP PERFORMED
Amphetamines: NOT DETECTED
Barbiturates: NOT DETECTED
Benzodiazepines: NOT DETECTED
Cocaine: NOT DETECTED
Opiates: POSITIVE — AB
Tetrahydrocannabinol: NOT DETECTED

## 2022-07-11 LAB — TSH: TSH: 1.412 u[IU]/mL (ref 0.350–4.500)

## 2022-07-11 LAB — TROPONIN I (HIGH SENSITIVITY)
Troponin I (High Sensitivity): 4 ng/L (ref <18)
Troponin I (High Sensitivity): 8 ng/L (ref <18)

## 2022-07-11 LAB — MAGNESIUM: Magnesium: 1.5 mg/dL — ABNORMAL LOW (ref 1.7–2.4)

## 2022-07-11 LAB — ETHANOL: Alcohol, Ethyl (B): <10 mg/dL (ref <10)

## 2022-07-11 MED ORDER — MAGNESIUM OXIDE 400 MG PO CAPS: 400.0000 mg | ORAL_CAPSULE | Freq: Every day | ORAL | 0 refills | Status: AC

## 2022-07-11 MED ORDER — CHLORDIAZEPOXIDE HCL 25 MG PO CAPS: ORAL_CAPSULE | ORAL | 0 refills | Status: DC

## 2022-07-11 MED ORDER — THIAMINE HCL 100 MG/ML IJ SOLN
100.0000 mg | Freq: Once | INTRAMUSCULAR | Status: AC
  Administered 2022-07-11: 100 mg via INTRAVENOUS
  Filled 2022-07-11: qty 2

## 2022-07-11 MED ORDER — MAGNESIUM OXIDE -MG SUPPLEMENT 400 (240 MG) MG PO TABS
800.0000 mg | ORAL_TABLET | Freq: Once | ORAL | Status: AC
  Administered 2022-07-11: 800 mg via ORAL
  Filled 2022-07-11: qty 2

## 2022-07-11 MED ORDER — POTASSIUM CHLORIDE ER 10 MEQ PO TBCR: 10.0000 meq | EXTENDED_RELEASE_TABLET | Freq: Two times a day (BID) | ORAL | 0 refills | Status: DC

## 2022-07-11 MED ORDER — FOLIC ACID 1 MG PO TABS: 1.0000 mg | ORAL_TABLET | Freq: Every day | ORAL | 0 refills | Status: AC

## 2022-07-11 MED ORDER — POTASSIUM CHLORIDE CRYS ER 20 MEQ PO TBCR
40.0000 meq | EXTENDED_RELEASE_TABLET | Freq: Once | ORAL | Status: AC
  Administered 2022-07-11: 40 meq via ORAL
  Filled 2022-07-11: qty 2

## 2022-07-11 MED ORDER — LACTATED RINGERS IV BOLUS
1000.0000 mL | Freq: Once | INTRAVENOUS | Status: AC
  Administered 2022-07-11: 1000 mL via INTRAVENOUS

## 2022-07-11 MED ORDER — AMLODIPINE BESYLATE 5 MG PO TABS: 5.0000 mg | ORAL_TABLET | Freq: Every day | ORAL | 1 refills | Status: AC

## 2022-07-11 MED ORDER — THIAMINE HCL 100 MG PO TABS: 100.0000 mg | ORAL_TABLET | Freq: Every day | ORAL | 0 refills | Status: AC

## 2022-07-11 MED ORDER — CHLORDIAZEPOXIDE HCL 25 MG PO CAPS
25.0000 mg | ORAL_CAPSULE | Freq: Once | ORAL | Status: AC
  Administered 2022-07-11: 25 mg via ORAL
  Filled 2022-07-11: qty 1

## 2022-07-11 MED ORDER — DIAZEPAM 5 MG/ML IJ SOLN
5.0000 mg | Freq: Once | INTRAMUSCULAR | Status: AC
  Administered 2022-07-11: 5 mg via INTRAVENOUS
  Filled 2022-07-11: qty 2

## 2022-07-11 MED ORDER — SODIUM CHLORIDE (PF) 0.9 % IJ SOLN
INTRAMUSCULAR | Status: AC
  Filled 2022-07-11: qty 50

## 2022-07-11 MED ORDER — FOLIC ACID 1 MG PO TABS
1.0000 mg | ORAL_TABLET | Freq: Once | ORAL | Status: AC
  Administered 2022-07-11: 1 mg via ORAL
  Filled 2022-07-11: qty 1

## 2022-07-11 MED ORDER — IOHEXOL 350 MG/ML SOLN
100.0000 mL | Freq: Once | INTRAVENOUS | Status: AC | PRN
  Administered 2022-07-11: 75 mL via INTRAVENOUS

## 2022-07-11 NOTE — ED Provider Notes (Addendum)
EMERGENCY DEPARTMENT AT [facility] Provider Note   CSN: [id number] Arrival date & time: 07/11/22  1106     History  Chief Complaint  Patient presents with   Tachycardia    Parker Carey is a 37 y.o. male.  With PMH of SVT status post ablation in 2016, EtOH use, on testosterone supplementation who presents with palpitations and shortness of breath.  He says he was on his tractor earlier this morning felt like his heart was racing and felt short of breath but no chest pain.  No pain with breathing.  Feels like his heart is racing but not similar to previous SVT.  No longer on beta-blockers or medications to control heart rate.  Had no syncopal episode.  Complains of some numbness in his arms but no focal weakness, no loss of sensation, no headache, no slurred speech, no facial droop, no leg weakness or numbness or tingling.  He has felt this intermittent tingling before in his arm.  Of note does drink alcohol daily and amount of drinks typically ranges from sometimes 2 to more than 4-6.  He drinks bourbon only.  Cannot remember the last time he stopped drinking but does not endorse any history of withdrawal in the past.  No drug use.  No history of PE or DVT.  No vomiting, no diarrhea, no fevers, no chills, no infectious symptoms, no urinary symptoms.  HPI     Home Medications Prior to Admission medications   Medication Sig Start Date End Date Taking? Authorizing Provider  amLODipine (NORVASC) 5 MG tablet Take 1 tablet (5 mg total) by mouth daily. 07/11/22  Yes Mardene Sayer, MD  chlordiazePOXIDE (LIBRIUM) 25 MG capsule 50mg  PO TID x 1D, then 25-50mg  PO BID X 1D, then 25-50mg  PO QD X 1D 07/11/22  Yes Mardene Sayer, MD  folic acid (FOLVITE) 1 MG tablet Take 1 tablet (1 mg total) by mouth daily. 07/11/22 08/10/22 Yes Mardene Sayer, MD  Magnesium Oxide 400 MG CAPS Take 1 capsule (400 mg total) by mouth daily for 5 days. 07/11/22 07/16/22 Yes Mardene Sayer, MD  potassium chloride (KLOR-CON) 10 MEQ tablet Take 1 tablet (10 mEq total) by mouth 2 (two) times daily for 5 days. 07/11/22 07/16/22 Yes Mardene Sayer, MD  thiamine (VITAMIN B1) 100 MG tablet Take 1 tablet (100 mg total) by mouth daily for 5 days. 07/11/22 07/16/22 Yes Mardene Sayer, MD  B-D 3CC LUER-LOK SYR 21GX1-1/2 21G X 1-1/2" 3 ML MISC U TO ADMINISTER TESTOSTERONE INJECTION Q 2 WEEKS 04/01/18   [provider]  Cholecalciferol (VITAMIN D) 2000 units tablet Take 1 tablet (2,000 Units total) by mouth daily. 04/03/17   Waldon Merl, PA-C  fluticasone [facility]) 50 MCG/ACT nasal spray SHAKE LIQUID AND USE 2 SPRAYS IN [facility] NOSTRIL DAILY 12/08/17   Waldon Merl, PA-C  HYDROcodone-acetaminophen (NORCO/VICODIN) 5-325 MG tablet Take 1 tablet by mouth every 6 (six) hours as needed for moderate pain. 08/28/21   Stoneking, Danford Bad., MD  Omega-3 Fatty Acids (FISH OIL) 1200 MG CAPS Take 1 capsule (1,200 mg total) by mouth daily. 04/03/17   Waldon Merl, PA-C  tamsulosin (FLOMAX) 0.4 MG CAPS capsule Take 1 capsule (0.4 mg total) by mouth daily. 09/20/21   Stoneking, Danford Bad., MD  testosterone cypionate (DEPOTESTOSTERONE CYPIONATE) 200 MG/ML injection INJECT INTO THE MUSCLE EVERY 2 WEEKS 03/31/18   [provider]      Allergies  Patient has no known allergies.    Review of Systems   Review of Systems  Physical Exam Updated Vital Signs BP 133/87   Pulse 85   Temp 98.9 F (37.2 C) (Oral)   Resp 15   Wt 74.8 kg   SpO2 100%   BMI 25.09 kg/m  Physical Exam Constitutional: Alert and oriented.  Anxious but no acute distress nontoxic Eyes: Conjunctivae are normal. ENT      Head: Normocephalic and atraumatic. Cardiovascular: S1, S2, tachycardic, regular rhythm, equal palpable bilateral radial pulses Respiratory: Normal respiratory effort. Breath sounds are normal.  Clear bilaterally.  O2 sat 100% on room air Gastrointestinal: Soft and  nontender.  Musculoskeletal: Normal range of motion in all extremities.      Right lower leg: No tenderness or edema.      Left lower leg: No tenderness or edema. Neurologic: Normal speech and language.  CN II through XII grossly intact.  5 out of 5 strength bilateral upper and lower extremities. Steady gait.  Sensation grossly intact other than reported subjective tingling of left arm.   Skin: Skin is warm, dry with ruddy appearance Psychiatric: Mood and affect are normal. Speech and behavior are normal.  ED Results / Procedures / Treatments   Labs (all labs ordered are listed, but only abnormal results are displayed) Labs Reviewed  COMPREHENSIVE METABOLIC PANEL - Abnormal; Notable for the following components:      Result Value   Sodium 134 (*)    Potassium 3.1 (*)    Calcium 7.6 (*)    All other components within normal limits  MAGNESIUM - Abnormal; Notable for the following components:   Magnesium 1.5 (*)    All other components within normal limits  RAPID URINE DRUG SCREEN, HOSP PERFORMED - Abnormal; Notable for the following components:   Opiates POSITIVE (*)    All other components within normal limits  CBC WITH DIFFERENTIAL/PLATELET  URINALYSIS, ROUTINE W REFLEX MICROSCOPIC  TSH  ETHANOL  TROPONIN I (HIGH SENSITIVITY)  TROPONIN I (HIGH SENSITIVITY)    EKG EKG Interpretation  Date/Time:  Thursday Jul 11 2022 14:56:49 EDT Ventricular Rate:  84 PR Interval:  192 QRS Duration: 106 QT Interval:  373 QTC Calculation: 441 R Axis:   39 Text Interpretation: Sinus rhythm Consider left atrial enlargement RSR' in V1 or V2, right VCD or RVH Nonspecific T wave abnormality Confirmed by Vivien Rossetti ([id number]) on 07/11/2022 3:05:36 PM  Radiology CT Angio Chest PE W and/or Wo Contrast  Result Date: 07/11/2022 CLINICAL DATA:  Shortness of breath. Tachycardia. Clinical suspicion for pulmonary embolism. EXAM: CT ANGIOGRAPHY CHEST WITH CONTRAST TECHNIQUE: Multidetector CT imaging  of the chest was performed using the standard protocol during bolus administration of intravenous contrast. Multiplanar CT image reconstructions and MIPs were obtained to evaluate the vascular anatomy. RADIATION DOSE REDUCTION: This exam was performed according to the departmental dose-optimization program which includes automated exposure control, adjustment of the mA and/or kV according to patient size and/or use of iterative reconstruction technique. CONTRAST:  75mL OMNIPAQUE IOHEXOL 350 MG/ML SOLN COMPARISON:  None Available. FINDINGS: Cardiovascular: Contrast opacification of pulmonary arteries is somewhat suboptimal due to bolus timing, however, no pulmonary emboli identified. No evidence of thoracic aortic dissection or aneurysm. Mediastinum/Nodes: No masses or pathologically enlarged lymph nodes identified. Lungs/Pleura: No pulmonary mass, infiltrate, or effusion. Upper abdomen: No acute findings. Musculoskeletal: No suspicious bone lesions identified. Review of the MIP images confirms the above findings. IMPRESSION: Negative. No evidence of pulmonary embolism or other  acute findings. Electronically Signed   By: Danae Orleans M.D.   On: 07/11/2022 14:34   CT Head Wo Contrast  Result Date: 07/11/2022 CLINICAL DATA:  tingling LUE EXAM: CT HEAD WITHOUT CONTRAST TECHNIQUE: Contiguous axial images were obtained from the base of the skull through the vertex without intravenous contrast. RADIATION DOSE REDUCTION: This exam was performed according to the departmental dose-optimization program which includes automated exposure control, adjustment of the mA and/or kV according to patient size and/or use of iterative reconstruction technique. COMPARISON:  MRI head 03/09/2018. FINDINGS: Brain: No evidence of acute infarction, hemorrhage, hydrocephalus, extra-axial collection or mass lesion/mass effect. Vascular: No hyperdense vessel. Skull: No acute fracture. Sinuses/Orbits: Clear sinuses.  No acute orbital findings.  Other: No mastoid effusions. IMPRESSION: No evidence of acute intracranial abnormality. Electronically Signed   By: Feliberto Harts M.D.   On: 07/11/2022 14:34    Procedures Procedures    Medications Ordered in ED Medications  lactated ringers bolus 1,000 mL (0 mLs Intravenous Stopped 07/11/22 1528)  diazepam (VALIUM) injection 5 mg (5 mg Intravenous Given 07/11/22 1141)  magnesium oxide (MAG-OX) tablet 800 mg (800 mg Oral Given 07/11/22 1326)  chlordiazePOXIDE (LIBRIUM) capsule 25 mg (25 mg Oral Given 07/11/22 1326)  thiamine (VITAMIN B1) injection 100 mg (100 mg Intravenous Given 07/11/22 1325)  folic acid (FOLVITE) tablet 1 mg (1 mg Oral Given 07/11/22 1326)  potassium chloride SA (KLOR-CON M) CR tablet 40 mEq (40 mEq Oral Given 07/11/22 1326)  iohexol (OMNIPAQUE) 350 MG/ML injection 100 mL (75 mLs Intravenous Contrast Given 07/11/22 1348)    ED Course/ Medical Decision Making/ A&P Clinical Course as of 07/11/22 1532  Thu Jul 11, 2022  1303 Heart rate now in the 90s with IV fluids and Valium. [VB]    Clinical Course User Index [VB] Mardene Sayer, MD                             Medical Decision Making  ZEV DIZON is a 37 y.o. male.  With PMH of SVT status post ablation in 2016, EtOH use, on testosterone supplementation who presents with palpitations and shortness of breath.    Initial EKG sinus tachycardia with nonspecific ST/T changes no evidence of STEMI.  He was also hypertensive on presentation with blood pressure 171/99.  Mild anxiety and tremulousness noted on exam.  Based on patient's alcohol history use, suspect patient was likely having symptoms related to alcohol withdrawal.  He improved with fluids and IV Valium with p.o. Librium.  Repeat EKG after fluids and Librium showed sinus rhythm heart rate 84 with no acute ST/T changes concerning for ischemia.  Repeat troponins 4 and 8 reassuring, doubt ACS especially with no active chest pain.  Suspect subjective  ongoing numbness and tingling related to electrolyte abnormalities and peripheral neuropathy possible vitamin deficiency with hypomagnesemia 1.5 hypokalemia 3.1.  His neurologic exam is overall unremarkable not consistent with CVA or LVO.  Creatinine was 0.74 within normal limits no evidence of AKI or renal dysfunction.  No transaminitis noted.  No anemia noted hemoglobin 16.4.  His UDS positive for opiates.  UA without evidence of UTI.  CT head reviewed by me no ICH or acute findings.  CTA PE study negative for PE or other acute findings.  His pulses are intact diffusely with and well appearance no chest pain no mediastinal widening no aortic abnormalities on CTA, not consistent with dissection.  Reassessed patient, heart rate  normalized blood pressure significantly improved with treatment of suspected alcohol withdrawal and IV fluids.  Magnesium and potassium supplemented here.  Patient would like to stop drinking.  Will start patient on Librium taper, magnesium and potassium supplementation, daily amlodipine and advised close follow-up with PCP regarding symptoms bringing him to the ER today.  He can have testosterone levels measured with PCP.  Did discuss with patient that testosterone can increase blood pressure and increased risk of stroke and heart attack.  I have placed referral for cardiology for risk stratification for CAD and referral to neurology for outpatient follow-up for numbness and tingling.  Discussed strict return precautions. Discharged in stable condition.  Amount and/or Complexity of Data Reviewed Labs: ordered. Radiology: ordered. ECG/medicine tests: ordered.  Risk OTC drugs. Prescription drug management.      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)  Numbness and tingling    Rx / DC Orders ED Discharge Orders          Ordered    amLODipine  (NORVASC) 5 MG tablet  Daily        07/11/22 1509    chlordiazePOXIDE (LIBRIUM) 25 MG capsule        07/11/22 1509    Magnesium Oxide 400 MG CAPS  Daily        07/11/22 1509    potassium chloride (KLOR-CON) 10 MEQ tablet  2 times daily        07/11/22 1509    thiamine (VITAMIN B1) 100 MG tablet  Daily        07/11/22 1509    folic acid (FOLVITE) 1 MG tablet  Daily        07/11/22 1509    Ambulatory referral to Cardiology       Comments: If you have not heard from the Cardiology office within the next 72 hours please call [phone].   07/11/22 1530    Ambulatory referral to Neurology       Comments: An appointment is requested in approximately: 2 weeks   07/11/22 1531              Mardene Sayer, MD 07/11/22 1522    Mardene Sayer, MD 07/11/22 1532    Mardene Sayer, MD 07/11/22 [phone]

## 2022-07-11 NOTE — Discharge Instructions (Addendum)
As we discussed, your workup was notable for slightly low magnesium and potassium levels.  Start taking the supplementation as prescribed.    Your high blood pressure and fast heart rate could be related to alcohol withdrawal.  Start taking the Librium as prescribed over the next few days and stop drinking any alcohol.  Your head imaging, chest imaging and EKG and blood work were overall reassuring.  There is no evidence of blood clot in your lungs.  Start taking the amlodipine and blood pressure medication as prescribed.  Follow-up with your primary care doctor regarding your visit to the ER today and symptoms that brought you to the ER.  Come back if any new severe chest pain, fainting episodes, severe shortness of breath, weakness of the arms or legs that is new, slurred speech that is new, facial droop, or any other symptoms concerning to you.  We have referred you to cardiology for outpatient follow-up for risk stratification for heart attacks in future.  We have referred you to neurology for further workup of numbness and tingling.

## 2022-07-11 NOTE — ED Triage Notes (Signed)
Pt states that his hears started racing about one hour ago.  This did not relieve with rest.  Pt has hx of SVT in the past which relieved after an ablation.  Pt reports left arm numbness and some sob with this.  No CP

## 2022-07-12 ENCOUNTER — Encounter: Payer: Self-pay | Admitting: Physical Medicine and Rehabilitation

## 2022-07-12 DIAGNOSIS — N200 Calculus of kidney: Secondary | ICD-10-CM | POA: Insufficient documentation

## 2022-07-12 NOTE — Progress Notes (Signed)
Parker Carey - 37 y.o. male MRN [id number]  Date of birth: 09-09-1985  Office Visit Note: Visit Date: 06/25/2022 PCP: Pcp, No Referred by: Juanda Chance, NP  Subjective: Chief Complaint  Patient presents with   Lower Back - Pain   HPI: Parker Carey is a 37 y.o. male who comes in today for evaluation and management for chronic long-term history of back pain and right hip pain and buttock pain with relief with intermittent epidural injection.  By way of review the last time I saw the patient was in August of last year and we completed S1 transforaminal epidural steroid injection with good relief of symptoms.  He will have ongoing symptoms from time to time with intermittent flareup.  He comes in today with several months of worsening right-sided low back pain some referral in the hip.  No groin pain.  No left-sided hip pain.  She does get some referral randomly into the posterior leg.  He has had no focal weakness no bowel or bladder changes.  Last MRI was completed in 2020.  This showed very shallow disc protrusion without any compression.  He was originally referred to [place] by Dr.Hilts.  He continues with home exercise of the has not had therapy in some time.  We did discuss home exercises and Kellie Simmering, PhD exercises.  He rates his average pain is only a 3 out of 10 but at times it can be pretty bad and debilitating.  He still is pretty active life without working things he likes to do.      Review of Systems  Musculoskeletal:  Positive for back pain and joint pain.  Neurological:  Positive for tingling.  All other systems reviewed and are negative.  Otherwise per HPI.  Assessment & Plan: Visit Diagnoses:    ICD-10-CM   1. Lumbar radiculopathy  M54.16 XR C-ARM NO REPORT    Epidural Steroid injection    methylPREDNISolone acetate (DEPO-MEDROL) injection 80 mg    2. Radiculopathy due to lumbar intervertebral disc disorder  M51.16 XR C-ARM NO REPORT    Epidural Steroid  injection    methylPREDNISolone acetate (DEPO-MEDROL) injection 80 mg    3. Chronic pain syndrome  G89.4 XR C-ARM NO REPORT    Epidural Steroid injection    methylPREDNISolone acetate (DEPO-MEDROL) injection 80 mg       Plan: Findings:  Chronic pain syndrome and chronic history of mechanical back pain is felt to be a combination of myofascial pain and possibly pain from small disc protrusion but no disc herniation noted on prior MRI just very small protrusion.  No compressive nature to this.  Has done well with epidural injection from time to time.  Long discussion today again on exercises and nature of back pain etc.  He will continue with current medication management and exercise program.  Would look at updating MRI depending on relief with injection.  Did decide to complete repeat injection today.  Consider repeat MRI with possible look at facet blocks versus more focused exercise program.    Meds & Orders:  Meds ordered this encounter  Medications   methylPREDNISolone acetate (DEPO-MEDROL) injection 80 mg    Orders Placed This Encounter  Procedures   XR C-ARM NO REPORT   Epidural Steroid injection    Follow-up: No follow-ups on file.   Procedures: No procedures performed      Clinical History: MRI LUMBAR SPINE WITHOUT CONTRAST    TECHNIQUE:  Multiplanar, multisequence MR imaging of  the lumbar spine was  performed. No intravenous contrast was administered.    COMPARISON:  Lumbar radiographs 04/21/2018    FINDINGS:  Segmentation: Normal    Alignment:  Normal    Vertebrae:  Normal bone marrow.  Negative for fracture or mass.    Conus medullaris and cauda equina: Conus extends to the L1 level.  Conus and cauda equina appear normal.    Paraspinal and other soft tissues: Negative for paraspinous mass or  edema    Disc levels:    L1-2: Negative    L2-3: Negative    L3-4: Negative    L4-5: Mild disc and mild facet degeneration. Negative for disc  protrusion or  stenosis    L5-S1: Shallow central disc protrusion. Negative for stenosis or  neural impingement.    IMPRESSION:  Mild lumbar degenerative change at L4-5 and L5-S1. Negative for  neural impingement or spinal stenosis.      Electronically Signed    By: Marlan Palau M.D.    On: 06/30/2018 12:50   He reports that he has quit smoking. His smoking use included cigarettes. He has a 2.25 pack-year smoking history. He has quit using smokeless tobacco.  His smokeless tobacco use included chew. No results for input(s): "HGBA1C", "LABURIC" in the last 8760 hours.  Objective:  VS:  HT:    WT:   BMI:     BP:   HR: bpm  TEMP: ( )  RESP:  Physical Exam Vitals and nursing note reviewed.  Constitutional:      General: He is not in acute distress.    Appearance: Normal appearance. He is not ill-appearing.  HENT:     Head: Normocephalic and atraumatic.     Right Ear: External ear normal.     Left Ear: External ear normal.     Nose: No congestion.  Eyes:     Extraocular Movements: Extraocular movements intact.  Cardiovascular:     Rate and Rhythm: Normal rate.     Pulses: Normal pulses.  Pulmonary:     Effort: Pulmonary effort is normal. No respiratory distress.  Abdominal:     General: There is no distension.     Palpations: Abdomen is soft.  Musculoskeletal:        General: No tenderness or signs of injury.     Cervical back: Neck supple.     Right lower leg: No edema.     Left lower leg: No edema.     Comments: Patient has good distal strength without clonus.  Skin:    Findings: No erythema or rash.  Neurological:     General: No focal deficit present.     Mental Status: He is alert and oriented to person, place, and time.     Sensory: No sensory deficit.     Motor: No weakness or abnormal muscle tone.     Coordination: Coordination normal.  Psychiatric:        Mood and Affect: Mood normal.        Behavior: Behavior normal.     Ortho Exam  Imaging: CT Angio Chest PE  W and/or Wo Contrast  Result Date: 07/11/2022 CLINICAL DATA:  Shortness of breath. Tachycardia. Clinical suspicion for pulmonary embolism. EXAM: CT ANGIOGRAPHY CHEST WITH CONTRAST TECHNIQUE: Multidetector CT imaging of the chest was performed using the standard protocol during bolus administration of intravenous contrast. Multiplanar CT image reconstructions and MIPs were obtained to evaluate the vascular anatomy. RADIATION DOSE REDUCTION: This exam was performed according to  the departmental dose-optimization program which includes automated exposure control, adjustment of the mA and/or kV according to patient size and/or use of iterative reconstruction technique. CONTRAST:  75mL OMNIPAQUE IOHEXOL 350 MG/ML SOLN COMPARISON:  None Available. FINDINGS: Cardiovascular: Contrast opacification of pulmonary arteries is somewhat suboptimal due to bolus timing, however, no pulmonary emboli identified. No evidence of thoracic aortic dissection or aneurysm. Mediastinum/Nodes: No masses or pathologically enlarged lymph nodes identified. Lungs/Pleura: No pulmonary mass, infiltrate, or effusion. Upper abdomen: No acute findings. Musculoskeletal: No suspicious bone lesions identified. Review of the MIP images confirms the above findings. IMPRESSION: Negative. No evidence of pulmonary embolism or other acute findings. Electronically Signed   By: Danae Orleans M.D.   On: 07/11/2022 14:34   CT Head Wo Contrast  Result Date: 07/11/2022 CLINICAL DATA:  tingling LUE EXAM: CT HEAD WITHOUT CONTRAST TECHNIQUE: Contiguous axial images were obtained from the base of the skull through the vertex without intravenous contrast. RADIATION DOSE REDUCTION: This exam was performed according to the departmental dose-optimization program which includes automated exposure control, adjustment of the mA and/or kV according to patient size and/or use of iterative reconstruction technique. COMPARISON:  MRI head 03/09/2018. FINDINGS: Brain: No  evidence of acute infarction, hemorrhage, hydrocephalus, extra-axial collection or mass lesion/mass effect. Vascular: No hyperdense vessel. Skull: No acute fracture. Sinuses/Orbits: Clear sinuses.  No acute orbital findings. Other: No mastoid effusions. IMPRESSION: No evidence of acute intracranial abnormality. Electronically Signed   By: Feliberto Harts M.D.   On: 07/11/2022 14:34    Past Medical/Family/Surgical/Social History: Medications & Allergies reviewed per EMR, new medications updated. Patient Active Problem List   Diagnosis Date Noted   Kidney stone 07/12/2022   Left ureteral calculus 08/23/2021   Chronic fatigue 04/02/2017   Visit for preventive health examination 04/02/2017   SVT (supraventricular tachycardia) 05/20/2014   Past Medical History:  Diagnosis Date   Back pain    L-5- S-1 back pain   History of chickenpox    History of kidney stones    Kidney stones    SVT (supraventricular tachycardia)    Family History  Problem Relation Age of Onset   Healthy Mother    Healthy Father    Cancer Maternal Grandmother    Hyperlipidemia Maternal Grandmother    Past Surgical History:  Procedure Laterality Date   EXTRACORPOREAL SHOCK WAVE LITHOTRIPSY Left 07/04/2016   Procedure: LEFT EXTRACORPOREAL SHOCK WAVE LITHOTRIPSY (ESWL);  Surgeon: Crist Fat, MD;  Location: [facility];  Service: Urology;  Laterality: Left;   EXTRACORPOREAL SHOCK WAVE LITHOTRIPSY Left 04/02/2018   Procedure: EXTRACORPOREAL SHOCK WAVE LITHOTRIPSY (ESWL);  Surgeon: Sebastian Ache, MD;  Location: [facility];  Service: Urology;  Laterality: Left;   EXTRACORPOREAL SHOCK WAVE LITHOTRIPSY Left 08/28/2021   Procedure: EXTRACORPOREAL SHOCK WAVE LITHOTRIPSY (ESWL);  Surgeon: Milderd Meager., MD;  Location: [facility];  Service: Urology;  Laterality: Left;   FRACTURE SURGERY     ORIF TIBIA & FIBULA FRACTURES Right 2005   SUPRAVENTRICULAR TACHYCARDIA ABLATION  05/20/2014   SUPRAVENTRICULAR TACHYCARDIA ABLATION  N/A 05/20/2014   Procedure: SUPRAVENTRICULAR TACHYCARDIA ABLATION;  Surgeon: Hillis Range, MD;  Location: [facility] CATH LAB;  Service: Cardiovascular;  Laterality: N/A;   Social History   Occupational History   Occupation: Plumber  Tobacco Use   Smoking status: Former    Packs/day: 0.25    Years: 9.00    Additional pack years: 0.00    Total pack years: 2.25    Types: Cigarettes   Smokeless tobacco: Former  Types: Chew  Vaping Use   Vaping Use: Never used  Substance and Sexual Activity   Alcohol use: Not Currently    Alcohol/week: 12.0 standard drinks of alcohol    Types: 12 Shots of liquor per week    Comment: occas   Drug use: No   Sexual activity: Yes

## 2022-07-12 NOTE — Procedures (Signed)
S1 Lumbosacral Transforaminal Epidural Steroid Injection - Sub-Pedicular Approach with Fluoroscopic Guidance   Patient: Parker Carey      Date of Birth: 12-09-1985 MRN: [id number] PCP: Pcp, No      Visit Date: 06/25/2022   Universal Protocol:    Date/Time: 05/31/244:13 AM  Consent Given By: the patient  Position:  PRONE  Additional Comments: Vital signs were monitored before and after the procedure. Patient was prepped and draped in the usual sterile fashion. The correct patient, procedure, and site was verified.   Injection Procedure Details:  Procedure Site One Meds Administered:  Meds ordered this encounter  Medications   methylPREDNISolone acetate (DEPO-MEDROL) injection 80 mg    Laterality: Right  Location/Site:  S1 Foramen   Needle size: 22 ga.  Needle type: Spinal  Needle Placement: Transforaminal  Findings:   -Comments: Excellent flow of contrast along the nerve, nerve root and into the epidural space.  Epidurogram: Contrast epidurogram showed no nerve root cut off or restricted flow pattern.  Procedure Details: After squaring off the sacral end-plate to get a true AP view, the C-arm was positioned so that the best possible view of the S1 foramen was visualized. The soft tissues overlying this structure were infiltrated with 2-3 ml. of 1% Lidocaine without Epinephrine.    The spinal needle was inserted toward the target using a "trajectory" view along the fluoroscope beam.  Under AP and lateral visualization, the needle was advanced so it did not puncture dura. Biplanar projections were used to confirm position. Aspiration was confirmed to be negative for CSF and/or blood. A 1-2 ml. volume of Isovue-250 was injected and flow of contrast was noted at each level. Radiographs were obtained for documentation purposes.   After attaining the desired flow of contrast documented above, a 0.5 to 1.0 ml test dose of 0.25% Marcaine was injected into each respective  transforaminal space.  The patient was observed for 90 seconds post injection.  After no sensory deficits were reported, and normal lower extremity motor function was noted,   the above injectate was administered so that equal amounts of the injectate were placed at each foramen (level) into the transforaminal epidural space.   Additional Comments:  The patient tolerated the procedure well Dressing: Band-Aid with 2 x 2 sterile gauze    Post-procedure details: Patient was observed during the procedure. Post-procedure instructions were reviewed.  Patient left the clinic in stable condition.

## 2022-08-21 ENCOUNTER — Ambulatory Visit: Payer: 59 | Admitting: Neurology

## 2022-09-05 NOTE — Progress Notes (Signed)
Cardiology Office Note:    Date:  09/06/2022   ID:  Parker Carey, DOB [birth year], MRN [id number]  PCP:  Aviva Kluver   [facility] Providers Cardiologist:  Maisie Fus, MD     Referring MD: Mardene Sayer, MD   No chief complaint on file. Palpitations  History of Present Illness:    Parker Carey is a 37 y.o. male with a hx of SVT ablation in 2016, etoh abuse, on testosterone. He went to the ED with palpitations 07/11/2022. EKG showed sinus tachycardia HR 126 bpm. UDS + for opiates. TSH is normal. Trops were negative. He thinks he had SVT, he did vagal maneuvers prior to his presentation. He states his HR >200 bpm on arrival at hospital.  He was given valium for management He denies loss of consciousness.  Past Medical History:  Diagnosis Date   Back pain    L-5- S-1 back pain   History of chickenpox    History of kidney stones    Kidney stones    SVT (supraventricular tachycardia)     Past Surgical History:  Procedure Laterality Date   EXTRACORPOREAL SHOCK WAVE LITHOTRIPSY Left 07/04/2016   Procedure: LEFT EXTRACORPOREAL SHOCK WAVE LITHOTRIPSY (ESWL);  Surgeon: Crist Fat, MD;  Location: [facility];  Service: Urology;  Laterality: Left;   EXTRACORPOREAL SHOCK WAVE LITHOTRIPSY Left 04/02/2018   Procedure: EXTRACORPOREAL SHOCK WAVE LITHOTRIPSY (ESWL);  Surgeon: Sebastian Ache, MD;  Location: [facility];  Service: Urology;  Laterality: Left;   EXTRACORPOREAL SHOCK WAVE LITHOTRIPSY Left 08/28/2021   Procedure: EXTRACORPOREAL SHOCK WAVE LITHOTRIPSY (ESWL);  Surgeon: Milderd Meager., MD;  Location: [facility];  Service: Urology;  Laterality: Left;   FRACTURE SURGERY     ORIF TIBIA & FIBULA FRACTURES Right 2005   SUPRAVENTRICULAR TACHYCARDIA ABLATION  05/20/2014   SUPRAVENTRICULAR TACHYCARDIA ABLATION N/A 05/20/2014   Procedure: SUPRAVENTRICULAR TACHYCARDIA ABLATION;  Surgeon: Hillis Range, MD;  Location: [facility] CATH LAB;  Service: Cardiovascular;  Laterality: N/A;     Current Medications: Current Meds  Medication Sig   amLODipine (NORVASC) 5 MG tablet Take 1 tablet (5 mg total) by mouth daily.   chlordiazePOXIDE (LIBRIUM) 25 MG capsule 50mg  PO TID x 1D, then 25-50mg  PO BID X 1D, then 25-50mg  PO QD X 1D   Cholecalciferol (VITAMIN D) 2000 units tablet Take 1 tablet (2,000 Units total) by mouth daily.   diltiazem (CARDIZEM) 30 MG tablet Take 1 tablet (30 mg total) by mouth every 6 (six) hours as needed (for palpitations).   Omega-3 Fatty Acids (FISH OIL) 1200 MG CAPS Take 1 capsule (1,200 mg total) by mouth daily.   Testosterone Undecanoate (JATENZO) 158 MG CAPS Take 158 mg by mouth daily.     Allergies:   Patient has no known allergies.   Social History   Socioeconomic History   Marital status: Married    Spouse name: Not on file   Number of children: Not on file   Years of education: Not on file   Highest education level: Not on file  Occupational History   Occupation: Plumber  Tobacco Use   Smoking status: Every Day    Current packs/day: 0.25    Average packs/day: 0.3 packs/day for 9.0 years (2.3 ttl pk-yrs)    Types: Cigarettes   Smokeless tobacco: Former    Types: Engineer, drilling   Vaping status: Never Used  Substance and Sexual Activity   Alcohol use: Not Currently    Alcohol/week: 12.0 standard drinks  of alcohol    Types: 12 Shots of liquor per week    Comment: occas   Drug use: No   Sexual activity: Yes  Other Topics Concern   Not on file  Social History Narrative   Not on file   Social Determinants of Health   Financial Resource Strain: Not on file  Food Insecurity: Not on file  Transportation Needs: Not on file  Physical Activity: Not on file  Stress: Not on file  Social Connections: Not on file     Family History: The patient's family history includes Cancer in his maternal grandmother; Healthy in his father and mother; Hyperlipidemia in his maternal grandmother.  ROS:   Please see the history of present  illness.     All other systems reviewed and are negative.  EKGs/Labs/Other Studies Reviewed:    The following studies were reviewed today: EKG Interpretation Date/Time:  Friday September 06 2022 08:18:45 EDT Ventricular Rate:  77 PR Interval:  198 QRS Duration:  100 QT Interval:  400 QTC Calculation: 452 R Axis:   77  Text Interpretation: Normal sinus rhythm Normal ECG When compared with ECG of 11-Jul-2022 14:56, PREVIOUS ECG IS PRESENT Confirmed by Carolan Clines ([id number]) on 09/06/2022 8:21:51 AM  EKG Interpretation Date/Time:  Friday September 06 2022 08:18:45 EDT Ventricular Rate:  77 PR Interval:  198 QRS Duration:  100 QT Interval:  400 QTC Calculation: 452 R Axis:   77  Text Interpretation: Normal sinus rhythm Normal ECG When compared with ECG of 11-Jul-2022 14:56, PREVIOUS ECG IS PRESENT Confirmed by Carolan Clines ([id number]) on 09/06/2022 8:21:51 AM    Recent Labs: 07/11/2022: ALT 25; BUN 11; Creatinine, Ser 0.74; Hemoglobin 16.4; Magnesium 1.5; Platelets 249; Potassium 3.1; Sodium 134; TSH 1.412  Recent Lipid Panel    Component Value Date/Time   CHOL 217 (H) 04/02/2017 0906   TRIG (H) 04/02/2017 0906    425.0 Triglyceride is over 400; calculations on Lipids are invalid.   HDL 41.30 04/02/2017 0906   CHOLHDL 5 04/02/2017 0906   LDLDIRECT 119.0 04/02/2017 0906     Risk Assessment/Calculations:     Physical Exam:    VS:  BP 136/78 (BP Location: Left Arm, Patient Position: Sitting, Cuff Size: Normal)   Pulse 77   Ht 5\' 8"  (1.727 m)   Wt 169 lb (76.7 kg)   SpO2 97%   BMI 25.70 kg/m     Wt Readings from Last 3 Encounters:  09/06/22 169 lb (76.7 kg)  07/11/22 165 lb (74.8 kg)  08/22/21 165 lb (74.8 kg)     GEN:  Well nourished, well developed in no acute distress HEENT: Normal NECK: No JVD; No carotid bruits LYMPHATICS: No lymphadenopathy CARDIAC: RRR, no murmurs, rubs, gallops RESPIRATORY:  Clear to auscultation without rales, wheezing or rhonchi  ABDOMEN: Soft,  non-tender, non-distended MUSCULOSKELETAL:  No edema; No deformity  SKIN: Warm and dry NEUROLOGIC:  Alert and oriented x 3 PSYCHIATRIC:  Normal affect   ASSESSMENT:    Palpitations Hx of SVT -there was no strip in the ED showing SVT. He has sinus tachycardia. Will hold off on EP consult until it is captured; if he is having recurrence. HTN PLAN:    In order of problems listed above:  Diltiazem 30mg  q6H 3 day ziopatch Continue norvasc 5 mg daily     Medication Adjustments/Labs and Tests Ordered: Current medicines are reviewed at length with the patient today.  Concerns regarding medicines are outlined above.  Orders Placed This Encounter  Procedures   LONG TERM MONITOR (3-14 DAYS)   EKG 12-Lead   Meds ordered this encounter  Medications   diltiazem (CARDIZEM) 30 MG tablet    Sig: Take 1 tablet (30 mg total) by mouth every 6 (six) hours as needed (for palpitations).    Dispense:  30 tablet    Refill:  6    Patient Instructions  Medication Instructions:   - START diltiazem 1 tablet every 6 hours as needed for palpitations.  *If you need a refill on your cardiac medications before your next appointment, please call your pharmacy*   Lab Work:  - None ordered   Testing/Procedures:  - A 3-day heart monitor has been ordered for you and it will be mailed to your house.  ZIO XT- Long Term Monitor Instructions  Your physician has requested you wear a ZIO patch monitor for 3 days.  This is a single patch monitor. [employer] supplies one patch monitor per enrollment. Additional stickers are not available. Please do not apply patch if you will be having a Nuclear Stress Test,  Echocardiogram, Cardiac CT, MRI, or Chest Xray during the period you would be wearing the  monitor. The patch cannot be worn during these tests. You cannot remove and re-apply the  ZIO XT patch monitor.  Your ZIO patch monitor will be mailed 3 day USPS to your address on file. It may take 3-5 days   to receive your monitor after you have been enrolled.  Once you have received your monitor, please review the enclosed instructions. Your monitor  has already been registered assigning a specific monitor serial # to you.  Billing and Patient Assistance Program Information  We have supplied [employer] with any of your insurance information on file for billing purposes. [employer] offers a sliding scale Patient Assistance Program for patients that do not have  insurance, or whose insurance does not completely cover the cost of the ZIO monitor.  You must apply for the Patient Assistance Program to qualify for this discounted rate.  To apply, please call [employer] at [phone], select option 4, select option 2, ask to apply for  Patient Assistance Program. Meredeth Ide will ask your household income, and how many people  are in your household. They will quote your out-of-pocket cost based on that information.  [employer] will also be able to set up a 25-month, interest-free payment plan if needed.  Applying the monitor   Shave hair from upper left chest.  Hold abrader disc by orange tab. Rub abrader in 40 strokes over the upper left chest as  indicated in your monitor instructions.  Clean area with 4 enclosed alcohol pads. Let dry.  Apply patch as indicated in monitor instructions. Patch will be placed under collarbone on left  side of chest with arrow pointing upward.  Rub patch adhesive wings for 2 minutes. Remove white label marked "1". Remove the white  label marked "2". Rub patch adhesive wings for 2 additional minutes.  While looking in a mirror, press and release button in center of patch. A small green light will  flash 3-4 times. This will be your only indicator that the monitor has been turned on.  Do not shower for the first 24 hours. You may shower after the first 24 hours.  Press the button if you feel a symptom. You will hear a small click. Record Date, Time and  Symptom in the Patient  Logbook.  When you are ready to remove the patch, follow instructions on the  last 2 pages of Patient  Logbook. Stick patch monitor onto the last page of Patient Logbook.  Place Patient Logbook in the blue and white box. Use locking tab on box and tape box closed  securely. The blue and white box has prepaid postage on it. Please place it in the mailbox as  soon as possible. Your physician should have your test results approximately 7 days after the  monitor has been mailed back to [facility]  Call [facility] Customer Care at [phone] if you have questions regarding  your ZIO XT patch monitor. Call them immediately if you see an orange light blinking on your  monitor.  If your monitor falls off in less than 4 days, contact our Monitor department at [phone].  If your monitor becomes loose or falls off after 4 days call [employer] at [phone] for  suggestions on securing your monitor    Follow-Up: At [facility], you and your health needs are our priority.  As part of our continuing mission to provide you with exceptional heart care, we have created designated Provider Care Teams.  These Care Teams include your primary Cardiologist (physician) and Advanced Practice Providers (APPs -  Physician Assistants and Nurse Practitioners) who all work together to provide you with the care you need, when you need it.  We recommend signing up for the patient portal called "MyChart".  Sign up information is provided on this After Visit Summary.  MyChart is used to connect with patients for Virtual Visits (Telemedicine).  Patients are able to view lab/test results, encounter notes, upcoming appointments, etc.  Non-urgent messages can be sent to your provider as well.   To learn more about what you can do with MyChart, go to [email or web address].    Your next appointment:    Follow-up as needed with Dr. Wyline Mood.    Signed, Maisie Fus, MD  09/06/2022 9:51 AM    [facility]

## 2022-09-06 ENCOUNTER — Ambulatory Visit (INDEPENDENT_AMBULATORY_CARE_PROVIDER_SITE_OTHER): Payer: 59

## 2022-09-06 ENCOUNTER — Encounter: Payer: Self-pay | Admitting: Internal Medicine

## 2022-09-06 ENCOUNTER — Ambulatory Visit: Payer: 59 | Admitting: Internal Medicine

## 2022-09-06 VITALS — BP 136/78 | HR 77 | Ht 68.0 in | Wt 169.0 lb

## 2022-09-06 DIAGNOSIS — R002 Palpitations: Secondary | ICD-10-CM

## 2022-09-06 DIAGNOSIS — I471 Supraventricular tachycardia, unspecified: Secondary | ICD-10-CM

## 2022-09-06 MED ORDER — DILTIAZEM HCL 30 MG PO TABS
30.0000 mg | ORAL_TABLET | Freq: Four times a day (QID) | ORAL | 6 refills | Status: DC | PRN
Start: 2022-09-06 — End: 2023-06-06

## 2022-09-06 NOTE — Patient Instructions (Addendum)
Medication Instructions:   - START diltiazem 1 tablet every 6 hours as needed for palpitations.  *If you need a refill on your cardiac medications before your next appointment, please call your pharmacy*   Lab Work:  - None ordered   Testing/Procedures:  - A 3-day heart monitor has been ordered for you and it will be mailed to your house.  ZIO XT- Long Term Monitor Instructions  Your physician has requested you wear a ZIO patch monitor for 3 days.  This is a single patch monitor. [employer] supplies one patch monitor per enrollment. Additional stickers are not available. Please do not apply patch if you will be having a Nuclear Stress Test,  Echocardiogram, Cardiac CT, MRI, or Chest Xray during the period you would be wearing the  monitor. The patch cannot be worn during these tests. You cannot remove and re-apply the  ZIO XT patch monitor.  Your ZIO patch monitor will be mailed 3 day USPS to your address on file. It may take 3-5 days  to receive your monitor after you have been enrolled.  Once you have received your monitor, please review the enclosed instructions. Your monitor  has already been registered assigning a specific monitor serial # to you.  Billing and Patient Assistance Program Information  We have supplied [employer] with any of your insurance information on file for billing purposes. [employer] offers a sliding scale Patient Assistance Program for patients that do not have  insurance, or whose insurance does not completely cover the cost of the ZIO monitor.  You must apply for the Patient Assistance Program to qualify for this discounted rate.  To apply, please call [employer] at [phone], select option 4, select option 2, ask to apply for  Patient Assistance Program. Meredeth Ide will ask your household income, and how many people  are in your household. They will quote your out-of-pocket cost based on that information.  [employer] will also be able to set up a 65-month,  interest-free payment plan if needed.  Applying the monitor   Shave hair from upper left chest.  Hold abrader disc by orange tab. Rub abrader in 40 strokes over the upper left chest as  indicated in your monitor instructions.  Clean area with 4 enclosed alcohol pads. Let dry.  Apply patch as indicated in monitor instructions. Patch will be placed under collarbone on left  side of chest with arrow pointing upward.  Rub patch adhesive wings for 2 minutes. Remove white label marked "1". Remove the white  label marked "2". Rub patch adhesive wings for 2 additional minutes.  While looking in a mirror, press and release button in center of patch. A small green light will  flash 3-4 times. This will be your only indicator that the monitor has been turned on.  Do not shower for the first 24 hours. You may shower after the first 24 hours.  Press the button if you feel a symptom. You will hear a small click. Record Date, Time and  Symptom in the Patient Logbook.  When you are ready to remove the patch, follow instructions on the last 2 pages of Patient  Logbook. Stick patch monitor onto the last page of Patient Logbook.  Place Patient Logbook in the blue and white box. Use locking tab on box and tape box closed  securely. The blue and white box has prepaid postage on it. Please place it in the mailbox as  soon as possible. Your physician should have your test results approximately  7 days after the  monitor has been mailed back to [place].  Call [facility] Customer Care at [phone] if you have questions regarding  your ZIO XT patch monitor. Call them immediately if you see an orange light blinking on your  monitor.  If your monitor falls off in less than 4 days, contact our Monitor department at [phone].  If your monitor becomes loose or falls off after 4 days call [employer] at [phone] for  suggestions on securing your monitor    Follow-Up: At [facility], you  and your health needs are our priority.  As part of our continuing mission to provide you with exceptional heart care, we have created designated Provider Care Teams.  These Care Teams include your primary Cardiologist (physician) and Advanced Practice Providers (APPs -  Physician Assistants and Nurse Practitioners) who all work together to provide you with the care you need, when you need it.  We recommend signing up for the patient portal called "MyChart".  Sign up information is provided on this After Visit Summary.  MyChart is used to connect with patients for Virtual Visits (Telemedicine).  Patients are able to view lab/test results, encounter notes, upcoming appointments, etc.  Non-urgent messages can be sent to your provider as well.   To learn more about what you can do with MyChart, go to [email or web address].    Your next appointment:    Follow-up as needed with Dr. Wyline Mood.

## 2022-09-06 NOTE — Progress Notes (Unsigned)
Enrolled patient for a 3 day Zio XT monitor to be mailed to patients home  

## 2022-10-29 DIAGNOSIS — I471 Supraventricular tachycardia, unspecified: Secondary | ICD-10-CM | POA: Diagnosis not present

## 2023-06-06 ENCOUNTER — Other Ambulatory Visit: Payer: Self-pay

## 2023-06-06 ENCOUNTER — Emergency Department (HOSPITAL_COMMUNITY)
Admission: EM | Admit: 2023-06-06 | Discharge: 2023-06-06 | Disposition: A | Discharge status: Home / Self Care | Attending: Emergency Medicine | Admitting: Emergency Medicine

## 2023-06-06 ENCOUNTER — Emergency Department (HOSPITAL_COMMUNITY)

## 2023-06-06 ENCOUNTER — Encounter (HOSPITAL_COMMUNITY): Payer: Self-pay

## 2023-06-06 DIAGNOSIS — R911 Solitary pulmonary nodule: Secondary | ICD-10-CM | POA: Insufficient documentation

## 2023-06-06 DIAGNOSIS — I471 Supraventricular tachycardia, unspecified: Secondary | ICD-10-CM | POA: Diagnosis not present

## 2023-06-06 DIAGNOSIS — R009 Unspecified abnormalities of heart beat: Secondary | ICD-10-CM | POA: Diagnosis present

## 2023-06-06 DIAGNOSIS — R002 Palpitations: Secondary | ICD-10-CM

## 2023-06-06 DIAGNOSIS — R000 Tachycardia, unspecified: Secondary | ICD-10-CM

## 2023-06-06 LAB — URINALYSIS, ROUTINE W REFLEX MICROSCOPIC
Bilirubin Urine: NEGATIVE
Glucose, UA: NEGATIVE mg/dL
Hgb urine dipstick: NEGATIVE
Ketones, ur: NEGATIVE mg/dL
Leukocytes,Ua: NEGATIVE
Nitrite: NEGATIVE
Protein, ur: NEGATIVE mg/dL
Specific Gravity, Urine: 1.013 (ref 1.005–1.030)
pH: 5 (ref 5.0–8.0)

## 2023-06-06 LAB — BASIC METABOLIC PANEL WITH GFR
Anion gap: 13 (ref 5–15)
BUN: 12 mg/dL (ref 6–20)
CO2: 18 mmol/L — ABNORMAL LOW (ref 22–32)
Calcium: 8.9 mg/dL (ref 8.9–10.3)
Chloride: 103 mmol/L (ref 98–111)
Creatinine, Ser: 0.87 mg/dL (ref 0.61–1.24)
GFR, Estimated: >60 mL/min (ref >60)
Glucose, Bld: 105 mg/dL — ABNORMAL HIGH (ref 70–99)
Potassium: 3.9 mmol/L (ref 3.5–5.1)
Sodium: 134 mmol/L — ABNORMAL LOW (ref 135–145)

## 2023-06-06 LAB — CBC
HCT: 45.3 % (ref 39.0–52.0)
Hemoglobin: 15.2 g/dL (ref 13.0–17.0)
MCH: 32.3 pg (ref 26.0–34.0)
MCHC: 33.6 g/dL (ref 30.0–36.0)
MCV: 96.2 fL (ref 80.0–100.0)
Platelets: 228 10*3/uL (ref 150–400)
RBC: 4.71 MIL/uL (ref 4.22–5.81)
RDW: 12.9 % (ref 11.5–15.5)
WBC: 8.3 10*3/uL (ref 4.0–10.5)
nRBC: 0 % (ref 0.0–0.2)

## 2023-06-06 LAB — RAPID URINE DRUG SCREEN, HOSP PERFORMED
Amphetamines: NOT DETECTED
Barbiturates: NOT DETECTED
Benzodiazepines: NOT DETECTED
Cocaine: NOT DETECTED
Opiates: NOT DETECTED
Tetrahydrocannabinol: NOT DETECTED

## 2023-06-06 LAB — TROPONIN I (HIGH SENSITIVITY)
Troponin I (High Sensitivity): 7 ng/L (ref <18)
Troponin I (High Sensitivity): 21 ng/L — ABNORMAL HIGH (ref <18)

## 2023-06-06 LAB — TSH: TSH: 1.178 u[IU]/mL (ref 0.350–4.500)

## 2023-06-06 MED ORDER — DILTIAZEM HCL 30 MG PO TABS: 30.0000 mg | ORAL_TABLET | Freq: Four times a day (QID) | ORAL | 0 refills | Status: AC | PRN

## 2023-06-06 MED ORDER — LORAZEPAM 2 MG/ML IJ SOLN
1.0000 mg | Freq: Once | INTRAMUSCULAR | Status: AC
  Administered 2023-06-06: 1 mg via INTRAVENOUS
  Filled 2023-06-06: qty 1

## 2023-06-06 NOTE — ED Provider Notes (Signed)
 [place] [facility] AT [facility] Provider Note   CSN: [id number] Arrival date & time: 06/06/23  1816     History  Chief Complaint  Patient presents with   Irregular Heart Beat    ERNST CUMPSTON is a 38 y.o. male.  Pt is a 38 yo male with pmhx significant for SVT, anxiety, EtOH use and kidney stones.  Pt felt like his HR went up today like it did with SVT in the past.  Pt tried to calm down and get his HR under control, but it did not go down.  He tried 1 shot of bourbon and that did not help, so he called EMS.  He was not in SVT upon EMS arrival and was in sinus tachy upon arrival to the ED.         Home Medications Prior to Admission medications   Medication Sig Start Date End Date Taking? Authorizing Provider  amLODipine  (NORVASC ) 5 MG tablet Take 1 tablet (5 mg total) by mouth daily. 07/11/22  Yes Branham, Victoria C, MD  Cholecalciferol (VITAMIN D ) 2000 units tablet Take 1 tablet (2,000 Units total) by mouth daily. 04/03/17  Yes Farris Hong, PA-C  ciprofloxacin  (CIPRO ) 500 MG tablet Take 500 mg by mouth 2 (two) times daily. 05/27/23 06/06/23 Yes [provider]  JATENZO 237 MG CAPS Take 1 capsule by mouth 2 (two) times daily.   Yes [provider]  oxyCODONE -acetaminophen  (PERCOCET) 10-325 MG tablet Take 1 tablet by mouth every 12 (twelve) hours as needed for pain. 05/28/23 06/06/23 Yes [provider]  tamsulosin  (FLOMAX ) 0.4 MG CAPS capsule Take 1 capsule (0.4 mg total) by mouth daily. 09/20/21  Yes Stoneking, Ponce Brisker., MD  B-D 3CC LUER-LOK SYR 21GX1-1/2 21G X 1-1/2" 3 ML MISC  04/01/18   [provider]  diltiazem  (CARDIZEM ) 30 MG tablet Take 1 tablet (30 mg total) by mouth every 6 (six) hours as needed (for palpitations). 06/06/23   Courtnie Brenes, MD      Allergies    Patient has no known allergies.    Review of Systems   Review of Systems  Cardiovascular:  Positive for palpitations.  All other systems  reviewed and are negative.   Physical Exam Updated Vital Signs BP (!) 122/98 (BP Location: Right Arm)   Pulse (!) 104   Temp 98.6 F (37 C) (Oral)   Resp 20   Ht 5\' 8"  (1.727 m)   Wt 73.5 kg   SpO2 98%   BMI 24.63 kg/m  Physical Exam Vitals and nursing note reviewed.  Constitutional:      Appearance: Normal appearance.  HENT:     Head: Normocephalic and atraumatic.     Right Ear: External ear normal.     Left Ear: External ear normal.     Nose: Nose normal.     Mouth/Throat:     Mouth: Mucous membranes are moist.     Pharynx: Oropharynx is clear.  Eyes:     Extraocular Movements: Extraocular movements intact.     Conjunctiva/sclera: Conjunctivae normal.     Pupils: Pupils are equal, round, and reactive to light.  Cardiovascular:     Rate and Rhythm: Regular rhythm. Tachycardia present.     Pulses: Normal pulses.     Heart sounds: Normal heart sounds.  Pulmonary:     Effort: Pulmonary effort is normal.     Breath sounds: Normal breath sounds.  Abdominal:     General: Abdomen is flat.  Bowel sounds are normal.     Palpations: Abdomen is soft.  Musculoskeletal:        General: Normal range of motion.     Cervical back: Normal range of motion and neck supple.  Skin:    General: Skin is warm.     Capillary Refill: Capillary refill takes less than 2 seconds.  Neurological:     General: No focal deficit present.     Mental Status: He is alert and oriented to person, place, and time.  Psychiatric:        Mood and Affect: Mood is anxious.     ED Results / Procedures / Treatments   Labs (all labs ordered are listed, but only abnormal results are displayed) Labs Reviewed  BASIC METABOLIC PANEL WITH GFR - Abnormal; Notable for the following components:      Result Value   Sodium 134 (*)    CO2 18 (*)    Glucose, Bld 105 (*)    All other components within normal limits  CBC  URINALYSIS, ROUTINE W REFLEX MICROSCOPIC  RAPID URINE DRUG SCREEN, HOSP PERFORMED  TSH   TROPONIN I (HIGH SENSITIVITY)  TROPONIN I (HIGH SENSITIVITY)    EKG EKG Interpretation Date/Time:  Friday June 06 2023 18:25:15 EDT Ventricular Rate:  121 PR Interval:  155 QRS Duration:  109 QT Interval:  311 QTC Calculation: 442 R Axis:   77  Text Interpretation: Sinus tachycardia RSR' in V1 or V2, right VCD or RVH Since last tracing rate faster Confirmed by Sueellen Emery [phone]) on 06/06/2023 7:04:59 PM  Radiology DG Chest Port 1 View Result Date: 06/06/2023 CLINICAL DATA:  Chest pain EXAM: PORTABLE CHEST 1 VIEW COMPARISON:  CT angiogram chest 07/11/2022 FINDINGS: The heart size and mediastinal contours are within normal limits. Small nodular density is seen in the right lower lung, indeterminate. The lungs are otherwise clear. There is no pleural effusion or pneumothorax. The visualized skeletal structures are unremarkable. IMPRESSION: 1. No active disease. 2. Small nodular density in the right lower lung, indeterminate. Recommend further evaluation with nonemergent chest CT. Electronically Signed   By: Tyron Gallon M.D.   On: 06/06/2023 19:38    Procedures Procedures    Medications Ordered in ED Medications  LORazepam  (ATIVAN ) injection 1 mg (1 mg Intravenous Given 06/06/23 1857)    ED Course/ Medical Decision Making/ A&P                                 Medical Decision Making Amount and/or Complexity of Data Reviewed Labs: ordered. Radiology: ordered.  Risk Prescription drug management.   This patient presents to the ED for concern of palpitations, this involves an extensive number of treatment options, and is a complaint that carries with it a high risk of complications and morbidity.  The differential diagnosis includes svt, sinus tachy, afib, electrolyte abn   Co morbidities that complicate the patient evaluation  SVT, anxiety, EtOH use and kidney stones   Additional history obtained:  Additional history obtained from epic chart review External  records from outside source obtained and reviewed including EMS report   Lab Tests:  I Ordered, and personally interpreted labs.  The pertinent results include:  cbc nl, bmp nl, trop nl, ua neg, uds neg, tsh nl   Imaging Studies ordered:  I ordered imaging studies including cxr  I independently visualized and interpreted imaging which showed  No active disease.  2.  Small nodular density in the right lower lung, indeterminate.  Recommend further evaluation with nonemergent chest CT.   I agree with the radiologist interpretation   Cardiac Monitoring:  The patient was maintained on a cardiac monitor.  I personally viewed and interpreted the cardiac monitored which showed an underlying rhythm of: sinus tachycardia initially, now nsr with HR in the 80s.   Medicines ordered and prescription drug management:  I ordered medication including ativan   for sx  Reevaluation of the patient after these medicines showed that the patient improved I have reviewed the patients home medicines and have made adjustments as needed   Test Considered:  cxr   Critical Interventions:  ativan    Problem List / ED Course:  Sinus tachycardia:  pt's labs are normal.  He has seen cards in the past and will be referred back to cards.  He needs a refill of his dilt. He is encouraged to avoid alcohol. He is stable for d/c.  Return if worse.  Pulmonary nodule:  pt will need a non-emergent outpatient CT chest    Reevaluation:  After the interventions noted above, I reevaluated the patient and found that they have :improved   Social Determinants of Health:  Lives at home   Dispostion:  After consideration of the diagnostic results and the patients response to treatment, I feel that the patent would benefit from discharge with outpatient f/u.          Final Clinical Impression(s) / ED Diagnoses Final diagnoses:  Sinus tachycardia  Pulmonary nodule  SVT (supraventricular tachycardia)  (HCC)    Rx / DC Orders ED Discharge Orders          Ordered    Ambulatory referral to Cardiology       Comments: Hx SVT   06/06/23 2059    diltiazem  (CARDIZEM ) 30 MG tablet  Every 6 hours PRN        06/06/23 2100              Anushri Casalino, MD 06/06/23 2103

## 2023-06-06 NOTE — Discharge Instructions (Addendum)
 You will need a non-emergent CT chest as an outpatient to further evaluate the pulmonary nodule seen on your chest xray today.

## 2023-06-06 NOTE — ED Notes (Signed)
 Patient comfortable at this this time with family at bedside.

## 2023-06-06 NOTE — ED Triage Notes (Signed)
 Pt arrived via [facility] with complaints of SVT's. Pt has history of svt's and states he felt it come on. Pt had ablation in the past. Pt did not want adenosine and states 12 mg needed in the past. 350 ml NS and zofran  given in route.

## 2023-07-30 ENCOUNTER — Other Ambulatory Visit: Payer: Self-pay | Admitting: Urology

## 2023-08-01 ENCOUNTER — Encounter (HOSPITAL_COMMUNITY): Payer: Self-pay | Admitting: Urology

## 2023-08-05 NOTE — Progress Notes (Signed)
 Left voicemail stating arrival to main entrance of [facility] at 0645, Nothing to eat or drink after midnight Thursday, take laxative on Thursday, hydrate well and eat a  light meal that evening. Bring driver's license, insurance card and blue folder, and no money valuables or credit cards. Will need a driver and caregiver for the first 24 hours, Do not take any NSAIDS, or vitamins, supplements after tonight, and may take norvasc , flomax , oxy, cardizem , jatenzo up until day of procedure. Do not wear metal from waist down and do not wear flilp flops or sandals. Will try to call back tomorrow, if not today. If having any issues on Friday, call [phone].

## 2023-08-06 ENCOUNTER — Encounter (HOSPITAL_COMMUNITY): Payer: Self-pay | Admitting: Urology

## 2023-08-06 NOTE — Progress Notes (Signed)
 Spoke w/ via phone for pre-op interview---Patient Lab needs dos----   KUB      Lab results------ COVID test ---Not indicated--patient states asymptomatic no test needed Arrive at -------[phone] NPO after MN  Pre-Surgery Ensure or G2:  Med rec completed Medications to take morning of surgery -----oxy, flomax , cardizem , if needed Diabetic medication -----  GLP1 agonist last dose: GLP1 instructions:  Patient instructed no nail polish to be worn day of surgery Patient instructed to bring photo id and insurance card day of surgery Patient aware to have Driver (ride ) / caregiver    for 24 hours after surgery - spouse- Parker Carey Patient Special Instructions -----Take laxative of choice on Thursday, drink plenty and eat light meal that evening. Bring driver's license, insurance card and blue folder Pre-Op special Instructions -----per [employer]  Patient verbalized understanding of instructions that were given at this phone interview. Patient denies chest pain, sob, fever, cough at the interview.

## 2023-08-06 NOTE — H&P (Signed)
 I have pain in the flank.  HPI: Parker Carey is a 38 year-old male patient who was referred by Cyndee Bullock, PA who is here for flank pain.  The problem is on the right side. His pain started about approximately 05/13/2023. The pain is dull. The pain is intermittent. The pain does radiate.   Pain medications< makes the pain better. Nothing causes the pain to become worse. He was treated with the following pain medication(s): Oxycodone .   He has had this same pain previously. He has had kidney stones.    -KUB from 07/09/2023 at [facility] showed a 7 mm proximal right ureteral stone with possible punctate right renal stones.  - He has a long history of kidney stones and has required ESWL in the past  - Currently, the patient reports intermittent episodes of dull right flank pain that radiates into the right inguinal region. He denies any associated nausea/vomiting, fever/chills or dysuria. He does note intermittent episodes of gross hematuria, but states that he is otherwise voiding without difficulty.     ALLERGIES: None    MEDICATIONS: Tamsulosin  HCl 0.4 MG Capsule  Ciprofloxacin   Gabapentin   Naproxen   oxyCODONE  HCl  Testosterone  Cypionate 200 MG/ML Solution     GU PSH: ESWL, Left - 2020, 2018 Locm 300-399Mg /Ml Iodine,1Ml - 2020, 2020       PSH Notes: cardiac catheter ablation 2016   NON-GU PSH: Leg surgery (unspecified) - 2005     GU PMH: Renal calculus - 2020, - 2020, - 2020 Gross hematuria - 2020, - 2020 Benign cyst of testis, No concerning abnormality on u/s exam today. Reassurance provided. - 2019 Renal and ureteral calculus (Improving), S/P ELSL of left ureteral stone--stone successfully treated. Pt's Ca== level 9.0. He needs metabolic eval. 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 - 2019 Low testosterone  -  2019    FAMILY HISTORY: No Family History    SOCIAL HISTORY: Marital Status: Married Preferred Language: English; Ethnicity: Not Hispanic Or Latino; Race: White Current Smoking Status: Patient smokes occasionally. Smokes less than 1/2 pack per day.   Tobacco Use Assessment Completed: Used Tobacco in last 30 days? Drinks 1 drink per day. Light Drinker.  Drinks 4+ caffeinated drinks per day. Patient's occupation Probation officer.    REVIEW OF SYSTEMS:    GU Review Male:   Patient denies frequent urination, hard to postpone urination, burning/ pain with urination, get up at night to urinate, leakage of urine, stream starts and stops, trouble starting your stream, have to strain to urinate , erection problems, and penile pain.  Gastrointestinal (Upper):   Patient denies nausea, vomiting, and indigestion/ heartburn.  Gastrointestinal (Lower):   Patient denies diarrhea and constipation.  Constitutional:   Patient denies fever, night sweats, weight loss, and fatigue.  Skin:   Patient denies skin rash/ lesion and itching.  Eyes:   Patient denies blurred vision and double vision.  Ears/ Nose/ Throat:   Patient denies sore throat and sinus problems.  Hematologic/Lymphatic:   Patient denies swollen glands and easy bruising.  Cardiovascular:   Patient denies leg swelling and chest pains.  Respiratory:   Patient denies cough and shortness of breath.  Endocrine:   Patient denies excessive thirst.  Musculoskeletal:   Patient denies joint pain and back pain.  Neurological:   Patient denies headaches and dizziness.  Psychologic:   Patient denies depression and anxiety.   VITAL SIGNS:      07/21/2023 11:08 AM  Weight 165 lb / 74.84 kg  Height 68 in / 172.72 cm  BP 115/71 mmHg  Pulse 74 /min  BMI 25.1 kg/m   MULTI-SYSTEM PHYSICAL EXAMINATION:    Constitutional: Well-nourished. No physical deformities. Normally developed. Good grooming. No acute distress  Neurologic / Psychiatric: Oriented to  time, oriented to place, oriented to person. No depression, no anxiety, no agitation.     Complexity of Data:  Records Review:   Previous Doctor Records, Previous Patient Records  X-Ray Review: Outside X-Ray: Reviewed Report. Discussed With Patient.     04/25/17  PSA  Total PSA 0.71 ng/mL    12/25/17  Hormones  Testosterone , Total 307.4 ng/dL    PROCEDURES:         KUB - 74018  A single view of the abdomen is obtained.      Patient confirmed No Neulasta OnPro Device.   There is a 7 mm calcification between the L2 and L3 processes along the expected course of the right proximal ureter, consistent with the stone seen on recent KUB. There are no calcifications seen within either renal shadow, along the expected course of the left ureter or within the region of the bladder. Multiple right pelvic phleboliths noted. No bony or bowel abnormalities are appreciated.         Urinalysis w/Scope Dipstick Dipstick Cont'd Micro  Color: Yellow Bilirubin: Neg mg/dL WBC/hpf: 0 - 5/hpf  Appearance: Clear Ketones: Neg mg/dL RBC/hpf: 3 - 89/yeq  Specific Gravity: 1.025 Blood: 2+ ery/uL Bacteria: Rare (0-9/hpf)  pH: 5.5 Protein: Neg mg/dL Cystals: NS (Not Seen)  Glucose: Neg mg/dL Urobilinogen: 0.2 mg/dL Casts: NS (Not Seen)    Nitrites: Neg Trichomonas: Not Present    Leukocyte Esterase: Neg leu/uL Mucous: Present      Epithelial Cells: 0 - 5/hpf      Yeast: NS (Not Seen)      Sperm: Not Present    ASSESSMENT:      ICD-10 Details  1 GU:   Flank Pain - R10.84 Right, Undiagnosed New Problem  2   Ureteral calculus - N20.1 Right, Undiagnosed New Problem   PLAN:            Medications Stop Meds: Percocet 5-325 MG Tablet 1 tablet PO Q 6 H PRN  Start: 03/27/2018  Discontinue: 07/21/2023  - Reason: dc'd Percocet 5-325 MG Tablet 1 tablet PO Q 6 H PRN  Start: 03/27/2018  Discontinue: 07/21/2023  - Reason: dc'd           Orders Labs Urine Culture  X-Rays: KUB          Schedule Return  Visit/Planned Activity: Next Available Appointment - Schedule Surgery          Document Letter(s):  Created for Patient: Clinical Summary         Notes:   The risks, benefits and alternatives of RIGHT ESWL was discussed with the patient. I described the risks which include arrhythmia, kidney contusion, kidney hemorrhage, need for transfusion, back discomfort, flank ecchymosis, flank abrasion, inability to break up stone, inability to pass stone fragments, Steinstrasse, infection associated with obstructing stones, need for different surgical procedure and possible need for repeat shockwave lithotripsy. The patient voices understanding and wishes to proceed.

## 2023-08-08 ENCOUNTER — Ambulatory Visit (HOSPITAL_COMMUNITY)

## 2023-08-08 ENCOUNTER — Encounter (HOSPITAL_COMMUNITY)
Admission: RE | Disposition: A | Discharge status: Home / Self Care | Payer: Self-pay | Source: Home / Self Care | Attending: Urology

## 2023-08-08 ENCOUNTER — Encounter (HOSPITAL_COMMUNITY): Payer: Self-pay | Admitting: Urology

## 2023-08-08 ENCOUNTER — Ambulatory Visit (HOSPITAL_COMMUNITY)
Admission: RE | Admit: 2023-08-08 | Discharge: 2023-08-08 | Disposition: A | Discharge status: Home / Self Care | Attending: Urology | Admitting: Urology

## 2023-08-08 ENCOUNTER — Other Ambulatory Visit: Payer: Self-pay

## 2023-08-08 DIAGNOSIS — F17210 Nicotine dependence, cigarettes, uncomplicated: Secondary | ICD-10-CM | POA: Diagnosis not present

## 2023-08-08 DIAGNOSIS — N201 Calculus of ureter: Secondary | ICD-10-CM | POA: Insufficient documentation

## 2023-08-08 DIAGNOSIS — R310 Gross hematuria: Secondary | ICD-10-CM | POA: Insufficient documentation

## 2023-08-08 HISTORY — PX: EXTRACORPOREAL SHOCK WAVE LITHOTRIPSY: SHX1557

## 2023-08-08 HISTORY — DX: Essential (primary) hypertension: I10

## 2023-08-08 HISTORY — DX: Cardiac arrhythmia, unspecified: I49.9

## 2023-08-08 SURGERY — LITHOTRIPSY, ESWL
Anesthesia: LOCAL | Laterality: Right

## 2023-08-08 MED ORDER — DIPHENHYDRAMINE HCL 25 MG PO CAPS
25.0000 mg | ORAL_CAPSULE | ORAL | Status: AC
  Administered 2023-08-08: 25 mg via ORAL
  Filled 2023-08-08: qty 1

## 2023-08-08 MED ORDER — HYDROCODONE-ACETAMINOPHEN 5-325 MG PO TABS: 1.0000 | ORAL_TABLET | Freq: Four times a day (QID) | ORAL | 0 refills | Status: AC | PRN

## 2023-08-08 MED ORDER — SODIUM CHLORIDE 0.9 % IV SOLN: INTRAVENOUS | Status: DC

## 2023-08-08 MED ORDER — CIPROFLOXACIN HCL 500 MG PO TABS
500.0000 mg | ORAL_TABLET | ORAL | Status: AC
  Administered 2023-08-08: 500 mg via ORAL
  Filled 2023-08-08: qty 1

## 2023-08-08 MED ORDER — ONDANSETRON 4 MG PO TBDP: 4.0000 mg | ORAL_TABLET | Freq: Three times a day (TID) | ORAL | 0 refills | Status: DC | PRN

## 2023-08-08 MED ORDER — DIAZEPAM 5 MG PO TABS
10.0000 mg | ORAL_TABLET | ORAL | Status: AC
  Administered 2023-08-08: 10 mg via ORAL
  Filled 2023-08-08: qty 2

## 2023-08-08 MED ORDER — BISACODYL 5 MG PO TBEC
5.0000 mg | DELAYED_RELEASE_TABLET | Freq: Every day | ORAL | Status: DC | PRN
Start: 2023-08-08 — End: 2023-08-08

## 2023-08-08 NOTE — Interval H&P Note (Signed)
 History and Physical Interval Note:  08/08/2023 7:56 AM  Parker Carey  has presented today for surgery, with the diagnosis of RIGHT URETERAL STONE.  The various methods of treatment have been discussed with the patient and family. After consideration of risks, benefits and other options for treatment, the patient has consented to  Procedure(s) with comments: LITHOTRIPSY, ESWL (Right) - RIGHT EXTRACORPOREAL SHOCKWAVE LITHOTRIPSY as a surgical intervention.  The patient's history has been reviewed, patient examined, no change in status, stable for surgery.  I have reviewed the patient's chart and labs.  Questions were answered to the patient's satisfaction.     Navpreet Szczygiel A Quincey Quesinberry

## 2023-08-08 NOTE — Discharge Instructions (Signed)
 I have reviewed discharge instructions in detail with the patient. They will follow-up with me or their physician as scheduled. My nurse will also be calling the patients as per protocol.

## 2023-08-11 ENCOUNTER — Encounter (HOSPITAL_COMMUNITY): Payer: Self-pay | Admitting: Urology

## 2023-11-19 ENCOUNTER — Telehealth: Payer: Self-pay | Admitting: Physical Medicine and Rehabilitation

## 2023-11-19 DIAGNOSIS — M5416 Radiculopathy, lumbar region: Secondary | ICD-10-CM

## 2023-11-19 NOTE — Telephone Encounter (Signed)
 Pt wife called wanting to make an appt for her husband for a cortisone injection. Call back number is [phone]

## 2023-12-15 ENCOUNTER — Encounter: Payer: Self-pay | Admitting: Radiology

## 2023-12-15 ENCOUNTER — Ambulatory Visit: Admitting: Physical Medicine and Rehabilitation

## 2023-12-15 ENCOUNTER — Other Ambulatory Visit: Payer: Self-pay

## 2023-12-15 VITALS — BP 144/100 | HR 86

## 2023-12-15 DIAGNOSIS — M79604 Pain in right leg: Secondary | ICD-10-CM | POA: Diagnosis not present

## 2023-12-15 DIAGNOSIS — M25551 Pain in right hip: Secondary | ICD-10-CM | POA: Diagnosis not present

## 2023-12-15 DIAGNOSIS — M5416 Radiculopathy, lumbar region: Secondary | ICD-10-CM

## 2023-12-15 MED ORDER — METHYLPREDNISOLONE ACETATE 40 MG/ML IJ SUSP
40.0000 mg | Freq: Once | INTRAMUSCULAR | Status: AC
  Administered 2023-12-15: 40 mg

## 2023-12-15 NOTE — Progress Notes (Signed)
 Parker Carey - 38 y.o. male MRN [id number]  Date of birth: 12-29-1985  Office Visit Note: Visit Date: 12/15/2023 PCP: Nena Cyndee LABOR, PA-C Referred by: Nena Cyndee LABOR, PA-C  Subjective: Chief Complaint  Patient presents with   Lower Back - Pain   HPI:  Parker Carey is a 38 y.o. male who comes in today for evaluation and management of ongoing chronic and recalcitrant right hip and leg pain.  By way of quick review he was originally a patient of Dr. Ozell Hilts in our office.  MRI from 2020 showed very mild disc bulging more right paracentral at L5-S1 with more S1 symptoms and he did initially very well with diagnostic and therapeutic right S1 transforaminal injection.  This is been repeated over the years with good relief diagnostically and therapeutically.  At the time he had tried and failed physical therapy but continues with home exercise program and stays pretty active.  The last time I saw him was in May of last year and we completed that injection at that time and he said it did help.  Since that time he has had a bout of supraventricular tachycardia which has been treated with procedure medication.  He has also had issues with kidney stone.  He has had no new trauma to the lumbar spine.  No left-sided complaints.  No focal weakness.   I spent more than 30 minutes speaking face-to-face with the patient with 50% of the time in counseling and discussing coordination of care.    Review of Systems  Musculoskeletal:  Positive for back pain and joint pain.  All other systems reviewed and are negative.  Otherwise per HPI.  Assessment & Plan: Visit Diagnoses:    ICD-10-CM   1. Lumbar radiculopathy  M54.16 XR C-ARM NO REPORT    Epidural Steroid injection    methylPREDNISolone  acetate (DEPO-MEDROL ) injection 40 mg    2. Pain in right hip  M25.551     3. Pain in right leg  M79.604       Plan: Findings:  Chronic recalcitrant right hip and leg pain more than back pain  but some back pain.  Pretty minimal findings on prior MRI in 2020.  No new trauma excetra.  I do think is reasonable to repeat the right S1 transforaminal injection given the paucity of exam findings but he does have a consistent slump test on the right.  I do think he would need an updated MRI at some point soon in the future just to rule out any other confounding factors that we would hate to miss with ongoing treatment.  Continue with home exercise program.  Injection completed today.    Meds & Orders:  Meds ordered this encounter  Medications   methylPREDNISolone  acetate (DEPO-MEDROL ) injection 40 mg    Orders Placed This Encounter  Procedures   XR C-ARM NO REPORT   Epidural Steroid injection    Follow-up: Return for visit to requesting provider as needed.   Procedures: No procedures performed  S1 Lumbosacral Transforaminal Epidural Steroid Injection - Sub-Pedicular Approach with Fluoroscopic Guidance   Patient: Parker Carey      Date of Birth: 07-13-1985 MRN: [id number] PCP: Nena Cyndee LABOR, PA-C      Visit Date: 12/15/2023   Universal Protocol:    Date/Time: 12/14/2508:13 AM  Consent Given By: the patient  Position:  PRONE  Additional Comments: Vital signs were monitored before and after the procedure. Patient was prepped and draped in the  usual sterile fashion. The correct patient, procedure, and site was verified.   Injection Procedure Details:  Procedure Site One Meds Administered:  Meds ordered this encounter  Medications   methylPREDNISolone  acetate (DEPO-MEDROL ) injection 40 mg    Laterality: Right  Location/Site:  S1 Foramen   Needle size: 22 ga.  Needle type: Spinal  Needle Placement: Transforaminal  Findings:   -Comments: Excellent flow of contrast along the nerve, nerve root and into the epidural space.  Epidurogram: Contrast epidurogram showed no nerve root cut off or restricted flow pattern.  Procedure Details: After squaring off  the sacral end-plate to get a true AP view, the C-arm was positioned so that the best possible view of the S1 foramen was visualized. The soft tissues overlying this structure were infiltrated with 2-3 ml. of 1% Lidocaine without Epinephrine.    The spinal needle was inserted toward the target using a trajectory view along the fluoroscope beam.  Under AP and lateral visualization, the needle was advanced so it did not puncture dura. Biplanar projections were used to confirm position. Aspiration was confirmed to be negative for CSF and/or blood. A 1-2 ml. volume of Isovue-250 was injected and flow of contrast was noted at each level. Radiographs were obtained for documentation purposes.   After attaining the desired flow of contrast documented above, a 0.5 to 1.0 ml test dose of 0.25% Marcaine  was injected into each respective transforaminal space.  The patient was observed for 90 seconds post injection.  After no sensory deficits were reported, and normal lower extremity motor function was noted,   the above injectate was administered so that equal amounts of the injectate were placed at each foramen (level) into the transforaminal epidural space.   Additional Comments:  The patient tolerated the procedure well Dressing: Band-Aid with 2 x 2 sterile gauze    Post-procedure details: Patient was observed during the procedure. Post-procedure instructions were reviewed.  Patient left the clinic in stable condition.   Clinical History: MRI LUMBAR SPINE WITHOUT CONTRAST    TECHNIQUE:  Multiplanar, multisequence MR imaging of the lumbar spine was  performed. No intravenous contrast was administered.    COMPARISON:  Lumbar radiographs 04/21/2018    FINDINGS:  Segmentation: Normal    Alignment:  Normal    Vertebrae:  Normal bone marrow.  Negative for fracture or mass.    Conus medullaris and cauda equina: Conus extends to the L1 level.  Conus and cauda equina appear normal.     Paraspinal and other soft tissues: Negative for paraspinous mass or  edema    Disc levels:    L1-2: Negative    L2-3: Negative    L3-4: Negative    L4-5: Mild disc and mild facet degeneration. Negative for disc  protrusion or stenosis    L5-S1: Shallow central disc protrusion. Negative for stenosis or  neural impingement.    IMPRESSION:  Mild lumbar degenerative change at L4-5 and L5-S1. Negative for  neural impingement or spinal stenosis.      Electronically Signed    By: Carlin Gaskins M.D.    On: 06/30/2018 12:50     Objective:  VS:  HT:    WT:   BMI:     BP:(!) 144/100  HR:86bpm  TEMP: ( )  RESP:  Physical Exam Vitals and nursing note reviewed.  Constitutional:      General: He is not in acute distress.    Appearance: Normal appearance. He is not ill-appearing.  HENT:  Head: Normocephalic and atraumatic.     Right Ear: External ear normal.     Left Ear: External ear normal.     Nose: No congestion.  Eyes:     Extraocular Movements: Extraocular movements intact.  Cardiovascular:     Rate and Rhythm: Normal rate.     Pulses: Normal pulses.  Pulmonary:     Effort: Pulmonary effort is normal. No respiratory distress.  Abdominal:     General: There is no distension.     Palpations: Abdomen is soft.  Musculoskeletal:        General: Tenderness present. No signs of injury.     Cervical back: Neck supple.     Right lower leg: No edema.     Left lower leg: No edema.     Comments: Patient has good distal strength without clonus.  Mild pain over the right greater trochanter compared to the left.  He has a positive slump test on the right with tight hamstring on the right.  Skin:    Findings: No erythema or rash.  Neurological:     General: No focal deficit present.     Mental Status: He is alert and oriented to person, place, and time.     Cranial Nerves: No cranial nerve deficit.     Sensory: No sensory deficit.     Motor: No weakness or abnormal  muscle tone.     Coordination: Coordination normal.     Gait: Gait normal.  Psychiatric:        Mood and Affect: Mood normal.        Behavior: Behavior normal.      Imaging: No results found.

## 2023-12-15 NOTE — Procedures (Signed)
 S1 Lumbosacral Transforaminal Epidural Steroid Injection - Sub-Pedicular Approach with Fluoroscopic Guidance   Patient: Parker Carey      Date of Birth: 1985/04/20 MRN: [id number] PCP: Nena Cyndee LABOR, PA-C      Visit Date: 12/15/2023   Universal Protocol:    Date/Time: 12/14/2508:13 AM  Consent Given By: the patient  Position:  PRONE  Additional Comments: Vital signs were monitored before and after the procedure. Patient was prepped and draped in the usual sterile fashion. The correct patient, procedure, and site was verified.   Injection Procedure Details:  Procedure Site One Meds Administered:  Meds ordered this encounter  Medications   methylPREDNISolone  acetate (DEPO-MEDROL ) injection 40 mg    Laterality: Right  Location/Site:  S1 Foramen   Needle size: 22 ga.  Needle type: Spinal  Needle Placement: Transforaminal  Findings:   -Comments: Excellent flow of contrast along the nerve, nerve root and into the epidural space.  Epidurogram: Contrast epidurogram showed no nerve root cut off or restricted flow pattern.  Procedure Details: After squaring off the sacral end-plate to get a true AP view, the C-arm was positioned so that the best possible view of the S1 foramen was visualized. The soft tissues overlying this structure were infiltrated with 2-3 ml. of 1% Lidocaine without Epinephrine.    The spinal needle was inserted toward the target using a trajectory view along the fluoroscope beam.  Under AP and lateral visualization, the needle was advanced so it did not puncture dura. Biplanar projections were used to confirm position. Aspiration was confirmed to be negative for CSF and/or blood. A 1-2 ml. volume of Isovue-250 was injected and flow of contrast was noted at each level. Radiographs were obtained for documentation purposes.   After attaining the desired flow of contrast documented above, a 0.5 to 1.0 ml test dose of 0.25% Marcaine  was injected  into each respective transforaminal space.  The patient was observed for 90 seconds post injection.  After no sensory deficits were reported, and normal lower extremity motor function was noted,   the above injectate was administered so that equal amounts of the injectate were placed at each foramen (level) into the transforaminal epidural space.   Additional Comments:  The patient tolerated the procedure well Dressing: Band-Aid with 2 x 2 sterile gauze    Post-procedure details: Patient was observed during the procedure. Post-procedure instructions were reviewed.  Patient left the clinic in stable condition.

## 2023-12-15 NOTE — Progress Notes (Signed)
 Pain Scale   Average Pain 2 Patient advising his pain radiates to his right leg. Patient advising his pain comes and goes without warning.        +Driver, -BT, -Dye Allergies.

## 2023-12-22 ENCOUNTER — Encounter: Payer: Self-pay | Admitting: Physical Medicine and Rehabilitation

## 2023-12-27 ENCOUNTER — Encounter (HOSPITAL_COMMUNITY): Payer: Self-pay | Admitting: Emergency Medicine

## 2023-12-27 ENCOUNTER — Emergency Department (HOSPITAL_COMMUNITY)

## 2023-12-27 ENCOUNTER — Other Ambulatory Visit: Payer: Self-pay

## 2023-12-27 ENCOUNTER — Emergency Department (HOSPITAL_COMMUNITY)
Admission: EM | Admit: 2023-12-27 | Discharge: 2023-12-27 | Disposition: A | Discharge status: Home / Self Care | Attending: Emergency Medicine | Admitting: Emergency Medicine

## 2023-12-27 DIAGNOSIS — Z87891 Personal history of nicotine dependence: Secondary | ICD-10-CM | POA: Diagnosis not present

## 2023-12-27 DIAGNOSIS — S0101XA Laceration without foreign body of scalp, initial encounter: Secondary | ICD-10-CM | POA: Insufficient documentation

## 2023-12-27 DIAGNOSIS — F1010 Alcohol abuse, uncomplicated: Secondary | ICD-10-CM | POA: Diagnosis not present

## 2023-12-27 DIAGNOSIS — Z23 Encounter for immunization: Secondary | ICD-10-CM | POA: Diagnosis not present

## 2023-12-27 DIAGNOSIS — I10 Essential (primary) hypertension: Secondary | ICD-10-CM | POA: Diagnosis not present

## 2023-12-27 DIAGNOSIS — W228XXA Striking against or struck by other objects, initial encounter: Secondary | ICD-10-CM | POA: Diagnosis not present

## 2023-12-27 MED ORDER — LIDOCAINE HCL (PF) 1 % IJ SOLN
10.0000 mL | Freq: Once | INTRAMUSCULAR | Status: AC
  Administered 2023-12-27: 10 mL
  Filled 2023-12-27: qty 10

## 2023-12-27 MED ORDER — TETANUS-DIPHTH-ACELL PERTUSSIS 5-2-15.5 LF-MCG/0.5 IM SUSP
0.5000 mL | Freq: Once | INTRAMUSCULAR | Status: AC
  Administered 2023-12-27: 0.5 mL via INTRAMUSCULAR
  Filled 2023-12-27: qty 0.5
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
## 2023-12-27 NOTE — Discharge Instructions (Signed)
 You were evaluated in the Emergency Department and after careful evaluation, we did not find any emergent condition requiring admission or further testing in the hospital.  Your exam/testing today is overall reassuring.  CT scan did not show any significant traumatic injuries.  We repaired your scalp laceration with stitches and staples.  The stitches are absorbable and will not need to be removed.  The staples will need to be removed by healthcare professional in 10 to 14 days.  Keep an eye out for symptoms of concussion as we discussed, recommend mental and physical rest for the next few days.  Please return to the Emergency Department if you experience any worsening of your condition.   Thank you for allowing us  to be a part of your care.

## 2024-01-09 ENCOUNTER — Emergency Department (HOSPITAL_BASED_OUTPATIENT_CLINIC_OR_DEPARTMENT_OTHER)
Admission: EM | Admit: 2024-01-09 | Discharge: 2024-01-09 | Disposition: A | Discharge status: Home / Self Care | Attending: Emergency Medicine | Admitting: Emergency Medicine

## 2024-01-09 ENCOUNTER — Other Ambulatory Visit: Payer: Self-pay

## 2024-01-09 ENCOUNTER — Emergency Department (HOSPITAL_BASED_OUTPATIENT_CLINIC_OR_DEPARTMENT_OTHER)

## 2024-01-09 DIAGNOSIS — N132 Hydronephrosis with renal and ureteral calculous obstruction: Secondary | ICD-10-CM | POA: Diagnosis not present

## 2024-01-09 DIAGNOSIS — Z79899 Other long term (current) drug therapy: Secondary | ICD-10-CM | POA: Diagnosis not present

## 2024-01-09 DIAGNOSIS — I10 Essential (primary) hypertension: Secondary | ICD-10-CM | POA: Insufficient documentation

## 2024-01-09 DIAGNOSIS — N1330 Unspecified hydronephrosis: Secondary | ICD-10-CM

## 2024-01-09 DIAGNOSIS — R10A1 Flank pain, right side: Secondary | ICD-10-CM | POA: Diagnosis present

## 2024-01-09 LAB — COMPREHENSIVE METABOLIC PANEL WITH GFR
ALT: 24 U/L (ref 0–44)
AST: 22 U/L (ref 15–41)
Albumin: 4.4 g/dL (ref 3.5–5.0)
Alkaline Phosphatase: 108 U/L (ref 38–126)
Anion gap: 11 (ref 5–15)
BUN: 15 mg/dL (ref 6–20)
CO2: 25 mmol/L (ref 22–32)
Calcium: 9.8 mg/dL (ref 8.9–10.3)
Chloride: 101 mmol/L (ref 98–111)
Creatinine, Ser: 0.7 mg/dL (ref 0.61–1.24)
GFR, Estimated: >60 mL/min (ref >60)
Glucose, Bld: 106 mg/dL — ABNORMAL HIGH (ref 70–99)
Potassium: 4.5 mmol/L (ref 3.5–5.1)
Sodium: 136 mmol/L (ref 135–145)
Total Bilirubin: 0.6 mg/dL (ref 0.0–1.2)
Total Protein: 7.3 g/dL (ref 6.5–8.1)

## 2024-01-09 LAB — CBC WITH DIFFERENTIAL/PLATELET
Abs Immature Granulocytes: 0.03 K/uL (ref 0.00–0.07)
Basophils Absolute: 0.1 K/uL (ref 0.0–0.1)
Basophils Relative: 1 %
Eosinophils Absolute: 0.1 K/uL (ref 0.0–0.5)
Eosinophils Relative: 2 %
HCT: 46.2 % (ref 39.0–52.0)
Hemoglobin: 16 g/dL (ref 13.0–17.0)
Immature Granulocytes: 1 %
Lymphocytes Relative: 27 %
Lymphs Abs: 1.6 K/uL (ref 0.7–4.0)
MCH: 33.1 pg (ref 26.0–34.0)
MCHC: 34.6 g/dL (ref 30.0–36.0)
MCV: 95.5 fL (ref 80.0–100.0)
Monocytes Absolute: 0.6 K/uL (ref 0.1–1.0)
Monocytes Relative: 10 %
Neutro Abs: 3.5 K/uL (ref 1.7–7.7)
Neutrophils Relative %: 59 %
Platelets: 226 K/uL (ref 150–400)
RBC: 4.84 MIL/uL (ref 4.22–5.81)
RDW: 12.7 % (ref 11.5–15.5)
WBC: 5.9 K/uL (ref 4.0–10.5)
nRBC: 0 % (ref 0.0–0.2)

## 2024-01-09 LAB — URINALYSIS, ROUTINE W REFLEX MICROSCOPIC
Bacteria, UA: NONE SEEN
Bilirubin Urine: NEGATIVE
Glucose, UA: NEGATIVE mg/dL
Ketones, ur: NEGATIVE mg/dL
Leukocytes,Ua: NEGATIVE
Nitrite: NEGATIVE
Protein, ur: NEGATIVE mg/dL
Specific Gravity, Urine: <1.005 — ABNORMAL LOW (ref 1.005–1.030)
pH: 6.5 (ref 5.0–8.0)

## 2024-01-09 MED ORDER — ONDANSETRON 4 MG PO TBDP: 4.0000 mg | ORAL_TABLET | Freq: Three times a day (TID) | ORAL | 0 refills | Status: AC | PRN

## 2024-01-09 MED ORDER — KETOROLAC TROMETHAMINE 15 MG/ML IJ SOLN
15.0000 mg | Freq: Once | INTRAMUSCULAR | Status: AC
Start: 2024-01-09 — End: 2024-01-09
  Administered 2024-01-09: 15 mg via INTRAVENOUS
  Filled 2024-01-09: qty 1

## 2024-01-09 MED ORDER — MORPHINE SULFATE (PF) 4 MG/ML IV SOLN
4.0000 mg | Freq: Once | INTRAVENOUS | Status: AC
  Administered 2024-01-09: 4 mg via INTRAVENOUS
  Filled 2024-01-09: qty 1

## 2024-01-09 MED ORDER — SODIUM CHLORIDE 0.9 % IV BOLUS
1000.0000 mL | Freq: Once | INTRAVENOUS | Status: AC
  Administered 2024-01-09: 1000 mL via INTRAVENOUS

## 2024-01-09 MED ORDER — OXYCODONE-ACETAMINOPHEN 10-325 MG PO TABS: 1.0000 | ORAL_TABLET | Freq: Four times a day (QID) | ORAL | 0 refills | Status: AC | PRN

## 2024-01-09 NOTE — ED Triage Notes (Signed)
 C/o R sided flank pain. Hx of kidney stones. Hx of multiple stones a year. Stated took Flomax  and antibiotics PTA. Denies fevers.

## 2024-01-09 NOTE — Discharge Instructions (Addendum)
 It was a pleasure taking care of you today. You were seen in the Emergency Department for right flank pain. Your work-up was reassuring. Your CT/Xray/Labs showed a 4 mm stone in the right ureterovesicular junction.  Due to the size, which should pass on its own.  I am sending a prescription for Percocet which is a narcotic for breakthrough pain.  Please continue to take ibuprofen and Tylenol  every 3 hours as needed for pain, and only take the Percocet if you are unable to get relief.  I am also sending you Zofran , for nausea.  Refer to the attached documentation for further management of your symptoms.  Please return to the emergency department if you begin experiencing worsening pain, nausea and vomiting with the inability to keep fluids down, or fever and bodyaches.  Please return to the ER if you experience chest pain, trouble breathing, intractable nausea/vomiting or any other life threatening illnesses.

## 2024-01-09 NOTE — ED Provider Notes (Signed)
 [place] EMERGENCY DEPARTMENT AT [facility] Provider Note   CSN: [id number] Arrival date & time: 01/09/24  1151     Patient presents with: Flank Pain   Parker Carey is a 38 y.o. male with past medical history of hypertension, prior kidney stones, who presents emergency department for evaluation of right flank pain.  Patient reports he has had this pain for the last 1 week as well as hematuria.  Patient has had a history of kidney stones in the past and he is normally able to pass them.  However, patient's pain has been gradually worsening over the last 1 week to the point where he feels unable to sleep.  He denies any fever, body aches, abdominal pain, vomiting.  Patient does report nausea last night secondary to the pain.  He does report that he took Aleve  most recently last night, but it did not relieve any of his pain.  He also states he has been taking Flomax  and some of his leftover Cipro  from prior infection.   Flank Pain       Prior to Admission medications   Medication Sig Start Date End Date Taking? Authorizing Provider  amLODipine  (NORVASC ) 5 MG tablet Take 1 tablet (5 mg total) by mouth daily. 07/11/22   Ethyl Richerd BROCKS, MD  B-D 3CC LUER-LOK SYR 21GX1-1/2 21G X 1-1/2 3 ML MISC  04/01/18   [provider]  Cholecalciferol (VITAMIN D ) 2000 units tablet Take 1 tablet (2,000 Units total) by mouth daily. 04/03/17   Gladis Elsie BROCKS, PA-C  diltiazem  (CARDIZEM ) 30 MG tablet Take 1 tablet (30 mg total) by mouth every 6 (six) hours as needed (for palpitations). 06/06/23   Haviland, Julie, MD  HYDROcodone -acetaminophen  (NORCO/VICODIN) 5-325 MG tablet Take 1-2 tablets by mouth every 6 (six) hours as needed for moderate pain (pain score 4-6). 08/08/23   MacDiarmid, Glendia, MD  JATENZO 237 MG CAPS Take 1 capsule by mouth 2 (two) times daily.    [provider]  Multiple Vitamin (MULTIVITAMIN) tablet Take 1 tablet by mouth daily.    [provider]  naproxen  sodium (ALEVE ) 220 MG tablet Take 440 mg by mouth 2 (two) times daily as needed.    [provider]  ondansetron  (ZOFRAN -ODT) 4 MG disintegrating tablet Take 1 tablet (4 mg total) by mouth every 8 (eight) hours as needed for nausea or vomiting. 08/08/23   MacDiarmid, Glendia, MD  oxyCODONE -acetaminophen  (PERCOCET) 10-325 MG tablet Take 1 tablet by mouth every 6 (six) hours as needed for pain.    [provider]  tamsulosin  (FLOMAX ) 0.4 MG CAPS capsule Take 1 capsule (0.4 mg total) by mouth daily. 09/20/21   Stoneking, Adine PARAS., MD    Allergies: Patient has no known allergies.    Review of Systems  Genitourinary:  Positive for flank pain and hematuria.    Updated Vital Signs BP (!) 141/78 (BP Location: Right Arm)   Pulse 89   Temp 98.1 F (36.7 C) (Oral)   Resp 18   SpO2 98%   Physical Exam Vitals and nursing note reviewed.  Constitutional:      Appearance: Normal appearance.  HENT:     Head: Normocephalic and atraumatic.     Mouth/Throat:     Mouth: Mucous membranes are moist.  Eyes:     General: No scleral icterus.       Right eye: No discharge.        Left eye: No discharge.     Conjunctiva/sclera:  Conjunctivae normal.  Cardiovascular:     Rate and Rhythm: Normal rate and regular rhythm.     Pulses: Normal pulses.  Pulmonary:     Effort: Pulmonary effort is normal.     Breath sounds: Normal breath sounds.  Abdominal:     General: There is no distension.     Tenderness: There is no abdominal tenderness. There is right CVA tenderness.     Comments: Abdomen is entirely nontender.  Right lower flank mildly tender to palpation.  Left flank nontender.  Musculoskeletal:        General: No deformity.     Cervical back: Normal range of motion.  Skin:    General: Skin is warm and dry.     Capillary Refill: Capillary refill takes less than 2 seconds.  Neurological:     Mental Status: He is alert.     Motor: No weakness.  Psychiatric:         Mood and Affect: Mood normal.     (all labs ordered are listed, but only abnormal results are displayed) Labs Reviewed  URINALYSIS, ROUTINE W REFLEX MICROSCOPIC - Abnormal; Notable for the following components:      Result Value   Color, Urine COLORLESS (*)    Specific Gravity, Urine <1.005 (*)    Hgb urine dipstick SMALL (*)    All other components within normal limits  COMPREHENSIVE METABOLIC PANEL WITH GFR - Abnormal; Notable for the following components:   Glucose, Bld 106 (*)    All other components within normal limits  CBC WITH DIFFERENTIAL/PLATELET    EKG: None  Radiology: CT Renal Stone Study Result Date: 01/09/2024 CLINICAL DATA:  Right flank pain.  History of kidney stones. EXAM: CT ABDOMEN AND PELVIS WITHOUT CONTRAST TECHNIQUE: Multidetector CT imaging of the abdomen and pelvis was performed following the standard protocol without IV contrast. RADIATION DOSE REDUCTION: This exam was performed according to the departmental dose-optimization program which includes automated exposure control, adjustment of the mA and/or kV according to patient size and/or use of iterative reconstruction technique. COMPARISON:  10/08/2023. FINDINGS: Lower chest: Clear lung bases. Hepatobiliary: No focal liver abnormality is seen. No gallstones, gallbladder wall thickening, or biliary dilatation. Pancreas: Unremarkable. No pancreatic ductal dilatation or surrounding inflammatory changes. Spleen: Normal in size without focal abnormality. Adrenals/Urinary Tract: Normal adrenal glands. Mild right hydronephrosis with moderate hydroureter. This is due to a 4 mm stone at the right ureterovesicular junction. No left hydronephrosis. No renal masses. No intrarenal stones. Current distal ureteral stone corresponds to the stone seen within the right kidney on the prior CT. Bladder is unremarkable. Stomach/Bowel: Stomach is within normal limits. Appendix appears normal. No evidence of bowel wall thickening,  distention, or inflammatory changes. Vascular/Lymphatic: No significant vascular findings are present. No enlarged abdominal or pelvic lymph nodes. Reproductive: Unremarkable. Other: None. Musculoskeletal: No fracture or acute finding.  No bone lesion. IMPRESSION: 1. 4 mm stone at the right ureterovesicular junction leads to right hydroureteronephrosis. 2. No other abnormalities. Electronically Signed   By: Alm Parkins M.D.   On: 01/09/2024 13:23    Procedures   Medications Ordered in the ED  ketorolac  (TORADOL ) 15 MG/ML injection 15 mg (has no administration in time range)  morphine (PF) 4 MG/ML injection 4 mg (4 mg Intravenous Given 01/09/24 1245)  sodium chloride  0.9 % bolus 1,000 mL (1,000 mLs Intravenous New Bag/Given 01/09/24 1302)  Medical Decision Making Amount and/or Complexity of Data Reviewed Labs: ordered. Radiology: ordered.  Risk Prescription drug management.   This patient presents to the ED for concern of left flank pain, this involves an extensive number of treatment options, and is a complaint that carries with it a high risk of complications and morbidity.   Differential diagnosis includes: UTI, pyelonephritis, nephrolithiasis, ureterolithiasis, muscle strain, muscle sprain  Co morbidities:  history of prior kidney stones, hypertension  Additional history: Patient is followed by urology, for prior stones.  Most recent note was from July 2023 which showed a 9 mm obstructing stone requiring lithotripsy.  Lab Tests:  I Ordered, and personally interpreted labs.  The pertinent results include: No acute abnormalities or evidence of infection  Imaging Studies:  I ordered imaging studies including CT renal stone study I independently visualized and interpreted imaging which showed   1. 4 mm stone at the right ureterovesicular junction leads to right  hydroureteronephrosis.  2. No other abnormalities.   I agree with the radiologist  interpretation  Cardiac Monitoring/ECG:  The patient was maintained on a cardiac monitor.  I personally viewed and interpreted the cardiac monitored which showed an underlying rhythm of: Normal sinus rhythm  Medicines ordered and prescription drug management:  I ordered medication including  Medications  ketorolac  (TORADOL ) 15 MG/ML injection 15 mg (has no administration in time range)  morphine (PF) 4 MG/ML injection 4 mg (4 mg Intravenous Given 01/09/24 1245)  sodium chloride  0.9 % bolus 1,000 mL (1,000 mLs Intravenous New Bag/Given 01/09/24 1302)   for pain control Reevaluation of the patient after these medicines showed that the patient improved I have reviewed the patients home medicines and have made adjustments as needed  Test Considered:   none  Critical Interventions:   none  Consultations Obtained: None  Problem List / ED Course:     ICD-10-CM   1. Hydronephrosis of right kidney  N13.30       MDM: 38 year old male who presents emergency department for evaluation of right flank pain.  Patient has a history of prior kidney stones, some of which he is able to pass on his own and others of which require lithotripsy.  Patient has been experiencing right flank plain, hematuria for the last week.  Pain has been worsening in severity to the point where the patient is unable to sleep.  Physical exam reveals mild tenderness to the right flank.  Patient's abdomen is nontender.  Vital signs are stable.  UA shows mild evidence of hemoglobin.  CBC and CMP shows no leukocytosis or any other acute abnormality.  I ordered a CT renal study which shows a 4 mm stone at the right ureterovesicular junction causing hydroureteronephrosis.  I have given the patient morphine for pain control as well as IV fluids to encourage urination.  Upon reassessment, patient appears to be in less pain.  However, after discussion with MD, he advised I give IV Toradol  for continued pain management.  I have  also educated the patient on continuing to take Tylenol  and ibuprofen every 3 hours as needed for pain management.  I am sending him a prescription of Percocet to use for breakthrough pain.  I have also sent Zofran  for nausea.  I have educated the patient that he should not take leftover antibiotics as the risk for resistance increases should he need them in the future.  Patient verbalizes his understanding to this.  I have discussed return precautions with the patient including worsening abdominal  pain, inability to urinate, nausea and vomiting, fever.  Patient's vital signs are stable.  Patient is appropriate for discharge at this time.   Dispostion:  After consideration of the diagnostic results and the patients response to treatment, I feel that the patient would benefit from supportive care and pain management.   Final diagnoses:  Hydronephrosis of right kidney    ED Discharge Orders     None          Torrence Marry GORMAN DEVONNA 01/09/24 1356    Charlyn Sora, MD 01/09/24 1520
